# Patient Record
Sex: Male | Born: 1965
Health system: Southern US, Community
[De-identification: ages and names within clinical notes are randomized; demographics above are authoritative.]

## PROBLEM LIST (undated history)

## (undated) DIAGNOSIS — Z8669 Personal history of other diseases of the nervous system and sense organs: Secondary | ICD-10-CM

## (undated) DIAGNOSIS — E785 Hyperlipidemia, unspecified: Secondary | ICD-10-CM

## (undated) DIAGNOSIS — F32A Depression, unspecified: Secondary | ICD-10-CM

## (undated) DIAGNOSIS — F419 Anxiety disorder, unspecified: Secondary | ICD-10-CM

## (undated) DIAGNOSIS — B019 Varicella without complication: Secondary | ICD-10-CM

## (undated) DIAGNOSIS — S22000A Wedge compression fracture of unspecified thoracic vertebra, initial encounter for closed fracture: Secondary | ICD-10-CM

## (undated) DIAGNOSIS — F329 Major depressive disorder, single episode, unspecified: Secondary | ICD-10-CM

## (undated) DIAGNOSIS — T7840XA Allergy, unspecified, initial encounter: Secondary | ICD-10-CM

## (undated) DIAGNOSIS — S2239XA Fracture of one rib, unspecified side, initial encounter for closed fracture: Secondary | ICD-10-CM

## (undated) HISTORY — DX: Hyperlipidemia, unspecified: E78.5

## (undated) HISTORY — PX: KYPHOPLASTY: SHX5884

## (undated) HISTORY — DX: Wedge compression fracture of unspecified thoracic vertebra, initial encounter for closed fracture: S22.000A

## (undated) HISTORY — DX: Depression, unspecified: F32.A

## (undated) HISTORY — DX: Fracture of one rib, unspecified side, initial encounter for closed fracture: S22.39XA

## (undated) HISTORY — PX: FEMUR FRACTURE SURGERY: SHX633

## (undated) HISTORY — PX: FRACTURE SURGERY: SHX138

## (undated) HISTORY — PX: SPINE SURGERY: SHX786

## (undated) HISTORY — DX: Anxiety disorder, unspecified: F41.9

## (undated) HISTORY — DX: Allergy, unspecified, initial encounter: T78.40XA

## (undated) HISTORY — DX: Varicella without complication: B01.9

## (undated) HISTORY — PX: FEMUR CLOSED REDUCTION: SHX939

## (undated) HISTORY — DX: Personal history of other diseases of the nervous system and sense organs: Z86.69

---
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
## 2003-07-28 ENCOUNTER — Ambulatory Visit (HOSPITAL_COMMUNITY): Admission: RE | Admit: 2003-07-28 | Discharge: 2003-07-28 | Payer: Self-pay | Admitting: Internal Medicine

## 2003-07-28 ENCOUNTER — Encounter: Payer: Self-pay | Admitting: Internal Medicine

## 2003-08-03 ENCOUNTER — Ambulatory Visit (HOSPITAL_COMMUNITY): Admission: RE | Admit: 2003-08-03 | Discharge: 2003-08-03 | Payer: Self-pay | Admitting: Internal Medicine

## 2003-08-09 ENCOUNTER — Ambulatory Visit (HOSPITAL_COMMUNITY): Admission: RE | Admit: 2003-08-09 | Discharge: 2003-08-09 | Payer: Self-pay | Admitting: Internal Medicine

## 2003-08-14 ENCOUNTER — Ambulatory Visit (HOSPITAL_COMMUNITY): Admission: RE | Admit: 2003-08-14 | Discharge: 2003-08-15 | Payer: Self-pay | Admitting: Internal Medicine

## 2005-07-03 ENCOUNTER — Ambulatory Visit: Payer: Self-pay | Admitting: Internal Medicine

## 2011-03-30 ENCOUNTER — Emergency Department (HOSPITAL_COMMUNITY): Payer: 59

## 2011-03-30 ENCOUNTER — Inpatient Hospital Stay (HOSPITAL_COMMUNITY): Payer: 59

## 2011-03-30 ENCOUNTER — Inpatient Hospital Stay (HOSPITAL_COMMUNITY)
Admission: EM | Admit: 2011-03-30 | Discharge: 2011-04-07 | DRG: 482 | Disposition: A | Discharge status: Skilled Nursing Facility | Payer: 59 | Attending: Orthopaedic Surgery | Admitting: Orthopaedic Surgery

## 2011-03-30 DIAGNOSIS — F341 Dysthymic disorder: Secondary | ICD-10-CM | POA: Diagnosis present

## 2011-03-30 DIAGNOSIS — E785 Hyperlipidemia, unspecified: Secondary | ICD-10-CM | POA: Diagnosis present

## 2011-03-30 DIAGNOSIS — W010XXA Fall on same level from slipping, tripping and stumbling without subsequent striking against object, initial encounter: Secondary | ICD-10-CM | POA: Diagnosis present

## 2011-03-30 DIAGNOSIS — Y92009 Unspecified place in unspecified non-institutional (private) residence as the place of occurrence of the external cause: Secondary | ICD-10-CM

## 2011-03-30 DIAGNOSIS — S7223XA Displaced subtrochanteric fracture of unspecified femur, initial encounter for closed fracture: Principal | ICD-10-CM | POA: Diagnosis present

## 2011-03-30 LAB — CBC
HCT: 47.4 % (ref 39.0–52.0)
Hemoglobin: 16.7 g/dL (ref 13.0–17.0)
RBC: 5.3 MIL/uL (ref 4.22–5.81)
WBC: 15.4 10*3/uL — ABNORMAL HIGH (ref 4.0–10.5)

## 2011-03-30 LAB — SAMPLE TO BLOOD BANK

## 2011-03-30 LAB — BASIC METABOLIC PANEL
Chloride: 103 mEq/L (ref 96–112)
GFR calc Af Amer: >60 mL/min (ref >60)
Potassium: 3.7 mEq/L (ref 3.5–5.1)
Sodium: 140 mEq/L (ref 135–145)

## 2011-04-01 ENCOUNTER — Inpatient Hospital Stay (HOSPITAL_COMMUNITY): Payer: 59

## 2011-04-01 LAB — CBC
HCT: 25.7 % — ABNORMAL LOW (ref 39.0–52.0)
MCV: 89.2 fL (ref 78.0–100.0)
RBC: 2.88 MIL/uL — ABNORMAL LOW (ref 4.22–5.81)
WBC: 10 10*3/uL (ref 4.0–10.5)

## 2011-04-01 LAB — PROTIME-INR: INR: 1.1 (ref 0.00–1.49)

## 2011-04-02 LAB — PROTIME-INR: INR: 1.14 (ref 0.00–1.49)

## 2011-04-02 LAB — CBC
HCT: 27.3 % — ABNORMAL LOW (ref 39.0–52.0)
MCV: 88.6 fL (ref 78.0–100.0)
Platelets: 139 10*3/uL — ABNORMAL LOW (ref 150–400)
RBC: 3.08 MIL/uL — ABNORMAL LOW (ref 4.22–5.81)
WBC: 9.9 10*3/uL (ref 4.0–10.5)

## 2011-04-03 LAB — PROTIME-INR
INR: 2.43 — ABNORMAL HIGH (ref 0.00–1.49)
Prothrombin Time: 26.5 seconds — ABNORMAL HIGH (ref 11.6–15.2)

## 2011-04-03 NOTE — Op Note (Signed)
[name], [name]                  ACCOUNT NO.:  [id number]  MEDICAL RECORD NO.:  [id number]           PATIENT TYPE:  I  LOCATION:  1602                         FACILITY:  [facility]  PHYSICIAN:  Vanita Panda. Magnus Ivan, M.D.DATE OF BIRTH:  10-30-1966  DATE OF PROCEDURE:  03/30/2011 DATE OF DISCHARGE:                              OPERATIVE REPORT   PREOPERATIVE DIAGNOSIS:  Right complex subtrochanteric femur fracture.  POSTOPERATIVE DIAGNOSIS:  Right complex subtrochanteric femur fracture.  PROCEDURE:  Intramedullary nail placement with Recon nail, right femur.  IMPLANTS:  Smith and Nephew 10 x 44 right femoral nail with two 6.4 proximal screws in Recon format and two 5.0 distal screws.  SURGEON:  Doneen Poisson, MD.  ANESTHESIA:  General.  ANTIBIOTICS:  2 g IV Ancef.  BLOOD LOSS:  300 cc.  COMPLICATIONS:  None.  INDICATIONS:  Mr. Joseph Harding is a 45 year old gentleman who is 6 feet 4 inches and slipped in his yard today, twisting and falling down very hard on his right hip.  He had the inability to ambulate and was seen at the [facility] emergency room.  He was found to have a comminuted subtrochanteric proximal femur fracture of the right femur.  Due to the nature of this fracture, I recommended he undergo open reduction and internal fixation with likely intramedullary construct using a Recon nail.  I talked to him about the possibility of a large incision for cerclage wiring to get the bones lined up.  I explained the risks and benefits of the surgery to him in detail and he did wish to proceed with surgery.  PROCEDURE IN DETAIL:  After informed consent was obtained, appropriate right hip was marked.  He was brought to the operating room, placed supine on the fracture table.  General anesthesia has been obtained.  A perineal post was placed and the right operative field was placed in large scale retraction and the left hip was abducted and flexed out of the field with a  stirrup and perineal padding as well.  The bed was raised under direct fluoroscopic guidance with a pull traction and assessed the fracture and found the hip to be significantly comminuted, but overall felt like I got his rotation in length corrected.  I then had the entire leg prepped and draped with DuraPrep and sterile drapes down past the knee.  I used sterile  shower curtain and Ioban as well. Timeout was called to denote the correct patient and correct right hip. First, my decision was to make a large incision to be able to bring the proximal femur back together in a cylinder to facilitate placement of an intramedullary nail.  As the fracture fragment seem to line up reasonably well, I made a decision to first start with small incisions. The small incisions were made proximal to the greater trochanter, I was able to palpate tip of the greater trochanter.  The guidewire was then placed via drill under direct fluoroscopic guidance and opened up the proximal femur with initiating reamer.  Next, using a fracture reducer, I was able to place a guide rod all the way down  the leg.  Again, it seemed that the fracture fragments fell and more with cylinder around the guide rods, so again I made decision not over the fracture yet since I was not seeing the proximal pieces significantly flex up.  I then placed the long guidewire and removed the fracture reducer.  I chose a 44 nail link that to make a measurement from that.  I then began reaming from the sizes 9 up to 11.5.  I made decision to put a 10 x 44 nail.  I placed this in an antegrade fashion down the femur and removed the guide rod.  I then made a separate stab incision along the lateral aspect of leg and I placed 2 Recon screws in the center position as possible in the head and likewise placed 2 distal interlocks with separate stab incision.  I then assessed the fractures thoroughly to decide whether or not I should open the fracture  further to place cerclage wires to make for a better appearing x-ray; however, I felt that it is young age in fact that he was not a smoker that I could let the biology of the fracture try to heal itself without any further stripping.  Thus, decision was made to leave the fracture fragments alone to hopefully get the bone healing.  I then copiously irrigated all the wounds and closed the deep tissue with 0 Vicryl followed with 2-0 Vicryl to subcutaneous tissue and interrupted staples on the skin.  Xeroform and well padded sterile dressings applied.  We then took the perineal pouch and brought the other leg out of the stirrups and both legs were resumed at similar length as well as rotation.  We took him off the fracture table.  He was awake and extubated, and taken to the recovery room in stable condition. All final counts were correct and there were no complications noted.     Vanita Panda. Magnus Ivan, M.D.     CYB/MEDQ  D:  03/30/2011  T:  03/31/2011  Job:  [id number]  Electronically Signed by Doneen Poisson M.D. on 04/03/2011 07:58:23 PM

## 2011-04-04 LAB — PROTIME-INR: INR: 2.72 — ABNORMAL HIGH (ref 0.00–1.49)

## 2011-04-05 LAB — PROTIME-INR: Prothrombin Time: 22.7 seconds — ABNORMAL HIGH (ref 11.6–15.2)

## 2011-04-06 LAB — CBC
Hemoglobin: 9.1 g/dL — ABNORMAL LOW (ref 13.0–17.0)
MCH: 29.8 pg (ref 26.0–34.0)
MCHC: 33.3 g/dL (ref 30.0–36.0)
MCV: 89.5 fL (ref 78.0–100.0)

## 2011-04-06 LAB — PROTIME-INR: Prothrombin Time: 24.3 seconds — ABNORMAL HIGH (ref 11.6–15.2)

## 2011-04-11 NOTE — Discharge Summary (Signed)
  Joseph Harding, Joseph Harding                  ACCOUNT NO.:  [id number]  MEDICAL RECORD NO.:  [id number]           PATIENT TYPE:  I  LOCATION:  1602                         FACILITY:  [facility]  PHYSICIAN:  Vanita Panda. Magnus Ivan, M.D.DATE OF BIRTH:  10-14-1966  DATE OF ADMISSION:  03/30/2011 DATE OF DISCHARGE:  04/06/2011                              DISCHARGE SUMMARY   ADMITTING DIAGNOSIS:  Right subtrochanteric femur fracture.  DISCHARGE DIAGNOSIS:  Right subtrochanteric femur fracture.  SECONDARY DIAGNOSES: 1. Hyperlipidemia. 2. Anxiety/depression.  PROCEDURES:  Open reduction and internal fixation of right subtrochanteric femur fracture on March 30, 2011.  HOSPITAL COURSE:  Joseph Harding is a 45 year old gentleman who is 6 feet 4 inches.  He sustained a mechanical fall when he slipped on wet substance in his yard on the day of admission.  He was taken to the operating room after having found to have a complex subtrochanteric femur fracture. The intramedullary nail was placed and he was admitted as an inpatient to the orthopedic floor.  Postoperatively, his mobility has been quite limited secondary to pain.  He has been on Coumadin for DVT prophylaxis, hence felt that he should be discharged to a skilled nursing facility short-term as he gets over his pain and due to the fact he is needing a lot of assistance.  By the day of discharge, he was still requiring a lot of assistance for mobility.  His incision was clean, dry, and intact.  He was therapeutic on his INR and Coumadin.  It was felt he could be discharge today for the skilled nursing facility.  DISPOSITION:  Discharge to skilled nursing facility.  DISCHARGE INSTRUCTIONS:  While he is at the skilled nursing facility. His incision cannot get wet in a shower.  He should remain on Coumadin for DVT prophylaxis and never should be made to mobilize him with nonweightbearing on his right leg until further notice.  A followup appointment,  which should be established in my office in 2 weeks after discharge.  As I said to the [employer], [phone].  DISCHARGE MEDICATIONS: 1. Percocet 5/325 mg 1 to 2 p.o. q.4 h. to 6 h. p.r.n. pain. 2. Robaxin 500 mg 1 p.o. q.6 h. p.r.n. spasms. 3. Lipitor 10 mg p.o. daily. 4. Clonazepam 1 mg p.o. b.i.d. p.r.n. 5. Zoloft 100 mg p.o. daily. 6. Coumadin 2.5 mg p.o. daily, dose per target INR of 2.0 to 3.0.     Vanita Panda. Magnus Ivan, M.D.     CYB/MEDQ  D:  04/06/2011  T:  04/06/2011  Job:  [id number]  Electronically Signed by Doneen Poisson M.D. on 04/11/2011 11:14:34 AM

## 2011-07-20 ENCOUNTER — Other Ambulatory Visit: Payer: Self-pay | Admitting: Orthopaedic Surgery

## 2011-07-20 ENCOUNTER — Ambulatory Visit
Admission: RE | Admit: 2011-07-20 | Discharge: 2011-07-20 | Disposition: A | Discharge status: Home / Self Care | Payer: 59 | Source: Ambulatory Visit | Attending: Orthopaedic Surgery | Admitting: Orthopaedic Surgery

## 2011-07-20 DIAGNOSIS — M898X5 Other specified disorders of bone, thigh: Secondary | ICD-10-CM

## 2011-07-29 ENCOUNTER — Other Ambulatory Visit: Payer: Self-pay | Admitting: Orthopaedic Surgery

## 2011-07-29 ENCOUNTER — Encounter (HOSPITAL_COMMUNITY): Payer: 59

## 2011-07-29 LAB — CBC
HCT: 44.4 % (ref 39.0–52.0)
Hemoglobin: 15.1 g/dL (ref 13.0–17.0)
MCV: 86 fL (ref 78.0–100.0)
RBC: 5.16 MIL/uL (ref 4.22–5.81)
RDW: 14.6 % (ref 11.5–15.5)
WBC: 8.7 10*3/uL (ref 4.0–10.5)

## 2011-07-29 LAB — SURGICAL PCR SCREEN
MRSA, PCR: NEGATIVE
Staphylococcus aureus: NEGATIVE

## 2011-07-29 LAB — PROTIME-INR: INR: 1.05 (ref 0.00–1.49)

## 2011-08-07 ENCOUNTER — Inpatient Hospital Stay (HOSPITAL_COMMUNITY): Payer: 59

## 2011-08-07 ENCOUNTER — Observation Stay (HOSPITAL_COMMUNITY)
Admission: RE | Admit: 2011-08-07 | Discharge: 2011-08-08 | Disposition: A | Discharge status: Home / Self Care | Payer: 59 | Source: Ambulatory Visit | Attending: Orthopaedic Surgery | Admitting: Orthopaedic Surgery

## 2011-08-07 ENCOUNTER — Observation Stay (HOSPITAL_COMMUNITY): Payer: 59

## 2011-08-07 DIAGNOSIS — IMO0002 Reserved for concepts with insufficient information to code with codable children: Principal | ICD-10-CM | POA: Insufficient documentation

## 2011-08-07 DIAGNOSIS — F17200 Nicotine dependence, unspecified, uncomplicated: Secondary | ICD-10-CM | POA: Insufficient documentation

## 2011-08-07 DIAGNOSIS — Z01812 Encounter for preprocedural laboratory examination: Secondary | ICD-10-CM | POA: Insufficient documentation

## 2011-08-07 DIAGNOSIS — Z79899 Other long term (current) drug therapy: Secondary | ICD-10-CM | POA: Insufficient documentation

## 2011-08-11 NOTE — Op Note (Signed)
NAMECYLAN, BORUM NO.:  [id number]  MEDICAL RECORD NO.:  [id number]  LOCATION:  1616                         FACILITY:  [facility]  PHYSICIAN:  Vanita Panda. Magnus Ivan, M.D.DATE OF BIRTH:  September 11, 1966  DATE OF PROCEDURE:  08/07/2011 DATE OF DISCHARGE:                              OPERATIVE REPORT   PREOPERATIVE DIAGNOSIS:  Nonunion, right subtrochanteric femur fracture, status post reconstruction nail placement.  POSTOPERATIVE DIAGNOSIS:  Nonunion right subtrochanteric femur fracture, status post reconstruction nail placement.  PROCEDURES: 1. Removal of previous reconstruction nail, right femur. 2. Exchange now right femur with larger diameter reconstruction nail. 3. Supplemental synthetic bone grafting to a proximal femur fracture,     nonunion, with Actifuse bone graft.  IMPLANTS:  Smith and Nephew 11.5, 44 right recon nail with two 6.4 locking screws proximally, no distal locking screws.  SURGEON:  Vanita Panda. Magnus Ivan, M.D.  ASSISTANT:  Wende Neighbors, P.A.  ANESTHESIA:  General.  BLOOD LOSS:  200 cc.  ANTIBIOTICS:  2 g IV Ancef.  COMPLICATIONS:  None.  INDICATIONS:  Mr. Mcgeehan is a 45 year old gentleman who in April of this year sustained a mechanical. Fall when he slipped and fell real hard in his yard on an incline in wet grass.  He had a quite significant subtrochanteric femur fracture.  I was able to take him to the operating room at that standpoint and place a 10 x 44 reconstruction nail.  We were able to get the bone in good alignment and with gradual increase to weightbearing, he could heal this fracture.  After continued pain and 3 months, I obtained a CT scan that showed a nonunion of the proximal femur.  It was at that point that I recommended he undergo exchange nailing and bone grafting.  The risks and benefits were explained him in detail.  I did counsel him on smoking, thus he does have smoke almost a pack a day.  He  understands the risks and benefits of surgery and did wish to proceed with surgery.  DESCRIPTION OF PROCEDURE:  After informed consent was obtained, appropriate right hip was marked, he was brought to the operating room, general anesthesia was obtained.  He was then placed on the Hana fracture table with the right operating leg and then long scale retraction of the left perineal post was placed in the left hip with flexion abduction placed in a well leg holder.  His right thigh and leg were then prepped and draped with DuraPrep and sterile drapes.  A time- out was called to identify the correct the patient and correct right leg.  I then first made an incision proximally using his previous incision, I was able to dissect down to the tip of the nail.  I cleansed soft tissue from this.  I was able to get the extraction device on the proximal aspect of the nail.  We then made a stab incision distally and removed the two distal interlocks and then made a wide incision proximally where we were going to need to do bone grafting, and I took out the 2 reconstruction screws to go up into the femoral head and neck. We then  backed the rod out in its entirety.  Next, we placed a long temporary guide rod and I reamed up to a size 13 mm reamer and got excellent chatter with this.  I then was able to place the 2 new proximal screws traversing the neck and head in the recon position.  Next, I used a curette to take down the nonunion of the most proximal aspect of the fracture.  I then packed this with Actifuse bone graft and decided not put any distal interlock.  Once we were able to do this, I closed the deep tissue with #1 Vicryl followed by 2-0 Vicryl in the subcutaneous tissue and staples on the skin.  Xeroform followed by a well-padded sterile dressing was applied.  He was then awakened, extubated, and taken to the recovery room in stable condition. Postoperatively, I am going to let him  weightbear as tolerated.  We will admit him for overnight observation with hopefully increasing his activities as comfort allows.  Of note, Maud Deed, PA-C, was present during the entirety of the case and participated throughout the case.  Her assistance was integral in takedown of the nonunion as well as placement of the new hardware.     Vanita Panda. Magnus Ivan, M.D.     CYB/MEDQ  D:  08/07/2011  T:  08/08/2011  Job:  [id number]  Electronically Signed by Doneen Poisson M.D. on 08/11/2011 12:34:45 PM

## 2011-08-11 NOTE — H&P (Signed)
  NAMECHANCELLOR, Joseph Harding NO.:  [id number]  MEDICAL RECORD NO.:  [id number]  LOCATION:  0005                         FACILITY:  [facility]  PHYSICIAN:  Joseph Harding, M.D.DATE OF BIRTH:  10-04-1966  DATE OF ADMISSION:  08/07/2011 DATE OF DISCHARGE:                             HISTORY & PHYSICAL   CHIEF COMPLAINT:  Right proximal femur pain/thigh pain with nonunion of subtrochanteric femur fracture.  HISTORY OF PRESENT ILLNESS:  Joseph Harding is a 45 year old gentleman who sustained a mechanical fall and __________ back in April of this year. He suffered a subtrochanteric femur fracture on the right side and underwent intramedullary nail placement with a Recon nail.  He has since gone on to not heal the fracture, so we are taking him to the operating room today for exchange with increasing the size of the nail and submental synthetic bone grafting.  He is a smoker and he is being trying to cut back on smoking, we have counseled him about this as well. We do need to proceed with the surgery and he understands this due to his continued thigh pain and when he gets a union of this fracture.  The risks and benefits are well-understood by him while we explained this to him.  PAST MEDICAL HISTORY: 1. High cholesterol. 2. Depression.  MEDICATIONS: 1. Lipitor. 2. Zoloft. 3. Oxycodone.  ALLERGIES:  No known drug allergies.  SOCIAL HISTORY:  He is a smoker.  FAMILY MEDICAL HISTORY:  Noncontributory.  REVIEW OF SYSTEMS:  Negative for chest pain, shortness of breath, fevers, chills, nausea, or vomiting.  PHYSICAL EXAMINATION:  VITAL SIGNS:  Seen on his medical record today. GENERAL:  He is alert and oriented x3, in no acute distress, slight discomfort. HEENT:  Normocephalic, atraumatic.  Pupils are equal, round, and reactive to light.  Extraocular movements are intact. LUNGS:  Clear to auscultation bilaterally. HEART:  Regular rate and rhythm. NECK:   Benign. ABDOMEN:  Benign. EXTREMITIES:  Right lower extremity shows pain with internal and external rotation of the hip.  X-rays and CT scan confirmed a nonunion of proximal right femur fracture in the subtrochanteric region.  ASSESSMENT:  This is a 45 year old gentleman with subtrochanteric femur fracture, nonunion.  PLAN:  We are going to take him to the operating room today again for an exchange and bone graft.  He understands the risks and benefits of this and wishes to proceed with the surgery.     Joseph Harding, M.D.     CYB/MEDQ  D:  08/07/2011  T:  08/07/2011  Job:  [id number]  Electronically Signed by Doneen Poisson M.D. on 08/11/2011 12:34:43 PM

## 2011-08-31 NOTE — Discharge Summary (Signed)
  [name], Harding NO.:  [id number]  MEDICAL RECORD NO.:  [id number]  LOCATION:  1616                         FACILITY:  [facility]  PHYSICIAN:  Vanita Panda. Magnus Ivan, M.D.DATE OF BIRTH:  30-Nov-1966  DATE OF ADMISSION:  08/07/2011 DATE OF DISCHARGE:  08/08/2011                              DISCHARGE SUMMARY   ADMITTING DIAGNOSIS:  Right subtrochanteric femur fracture, nonunion.  DISCHARGE DIAGNOSIS:  Right subtrochanteric femur fracture, nonunion.  PROCEDURES: 1. Exchange of intramedullary nail of right femur. 2. Supplemental bone graft with Allograft bone graft of right proximal     femur.  HOSPITAL COURSE:  [name] is a gentleman who earlier in April had a mechanic fall in his yard, sustained a subtrochanteric femur fracture of the right leg.  He underwent open reduction and internal fixation with a Recon type of nail and we let him weightbear as tolerated after some period time of healing.  He is a smoker, now he demonstrated a nonunion of the proximal femur fracture, confirmed by a CT scan.  He also had some pain.  We talked to him about exchanging the rod for bigger rod with reaming and then bone grafting.  He was taken to the operating room on the day of admission and underwent the aforementioned procedure without complications.  He was then admitted for 23 hours of observations with lying and weightbear as tolerated.  He was quite comfortable on the day of discharge.  The incisions were clean, dry, and intact.  It was felt he could be discharged safely to home.  DISPOSITION:  Discharged to home.  DISCHARGE INSTRUCTIONS:  While he is at home, can weightbear as tolerated on his right lower extremity.  He can get his incisions wet in shower in 4 to 5 days.  We will see him back in the office in 2 weeks.  DISCHARGE MEDICATIONS: 1. Percocet. 2. Robaxin.     Vanita Panda. Magnus Ivan, M.D.     CYB/MEDQ  D:  08/23/2011  T:  08/23/2011  Job:   [id number]  Electronically Signed by Doneen Poisson M.D. on 08/31/2011 07:12:30 PM

## 2011-12-18 ENCOUNTER — Ambulatory Visit (INDEPENDENT_AMBULATORY_CARE_PROVIDER_SITE_OTHER): Payer: 59

## 2011-12-18 DIAGNOSIS — E785 Hyperlipidemia, unspecified: Secondary | ICD-10-CM

## 2011-12-18 DIAGNOSIS — F17200 Nicotine dependence, unspecified, uncomplicated: Secondary | ICD-10-CM

## 2011-12-18 DIAGNOSIS — F341 Dysthymic disorder: Secondary | ICD-10-CM

## 2012-03-20 ENCOUNTER — Ambulatory Visit (INDEPENDENT_AMBULATORY_CARE_PROVIDER_SITE_OTHER): Payer: 59 | Admitting: Internal Medicine

## 2012-03-20 VITALS — BP 122/84 | HR 78 | Temp 99.1°F | Resp 16 | Ht 74.5 in | Wt 225.6 lb

## 2012-03-20 DIAGNOSIS — S7290XA Unspecified fracture of unspecified femur, initial encounter for closed fracture: Secondary | ICD-10-CM

## 2012-03-20 DIAGNOSIS — F411 Generalized anxiety disorder: Secondary | ICD-10-CM | POA: Insufficient documentation

## 2012-03-20 DIAGNOSIS — S72309A Unspecified fracture of shaft of unspecified femur, initial encounter for closed fracture: Secondary | ICD-10-CM

## 2012-03-20 DIAGNOSIS — E789 Disorder of lipoprotein metabolism, unspecified: Secondary | ICD-10-CM

## 2012-03-20 HISTORY — DX: Unspecified fracture of shaft of unspecified femur, initial encounter for closed fracture: S72.309A

## 2012-03-20 MED ORDER — SERTRALINE HCL 100 MG PO TABS: 100.0000 mg | ORAL_TABLET | Freq: Every day | ORAL | Status: DC

## 2012-03-20 MED ORDER — CLONAZEPAM 1 MG PO TABS: 1.0000 mg | ORAL_TABLET | Freq: Every day | ORAL | Status: DC

## 2012-03-20 MED ORDER — ATORVASTATIN CALCIUM 10 MG PO TABS: 10.0000 mg | ORAL_TABLET | Freq: Every day | ORAL | Status: DC

## 2012-03-20 NOTE — Progress Notes (Signed)
  Subjective:    Patient ID: Joseph Harding, male    DOB: 1966/01/10, 46 y.o.   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 His initial panic attack occurred while driving to [place] for work and precluded jogging for about 6 months although that has passed  See last visit-cholesterol needs treatment but he was not given a refill of medication    Review of SystemsConstitution negative HEENT clear No palpitations chest pain shortness of breath No weight loss or weight gain Recent surgery x2 on his right femur after a fracture from falling in the yard the initial surgery he needed a subsequent repair/he is now active without pain     Objective:   Physical ExamVital signs are stable except weight 225 although he is 6 feet 2 inches  Neuropsych is stable     Assessment & Plan:  Problem #1 generalized anxiety disorder Problem #2 hyperlipidemia  Refill Zoloft and Klonopin for 6 months  Restart Lipitor 10 mg and check labs at [facility] in September

## 2012-04-20 ENCOUNTER — Other Ambulatory Visit: Payer: Self-pay | Admitting: Family Medicine

## 2012-05-03 IMAGING — RF DG FEMUR 2+V*R*
1 series · 7 of 7 positions shown · non-contrast
Comparison: Plain films 04/01/2011.

CLINICAL DATA: Exchange of IM nail and bone graft placement.

RIGHT FEMUR - 2 VIEW

[Series 1: run · 7 of 7 slices shown]
[im 1/7]
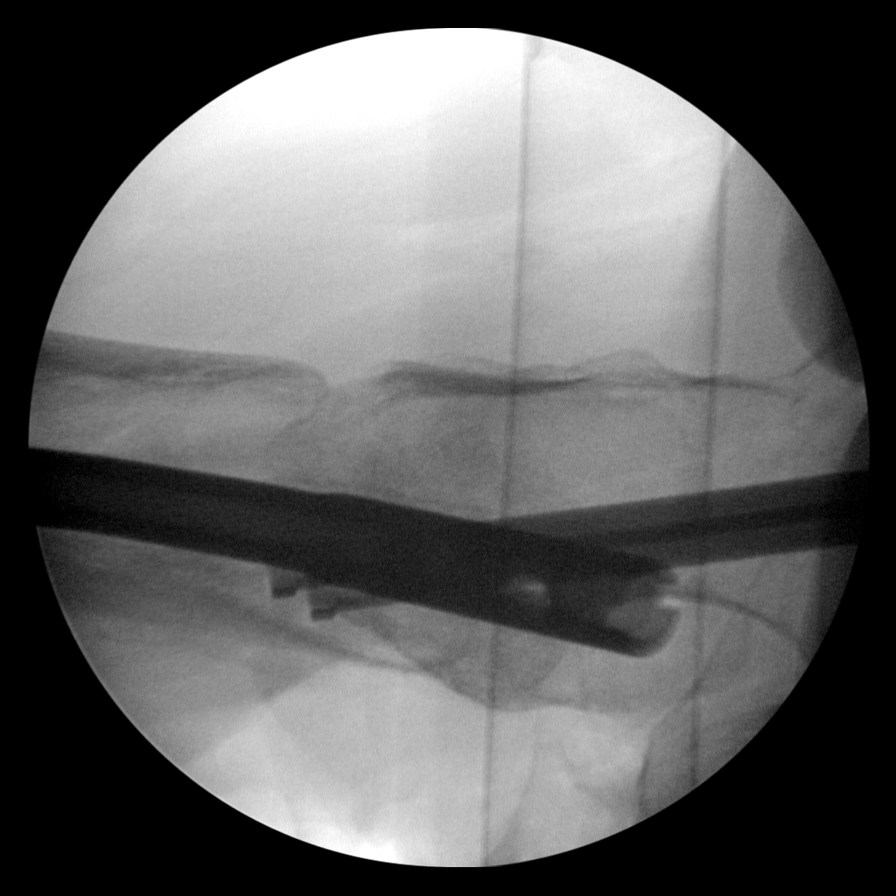
[im 2/7]
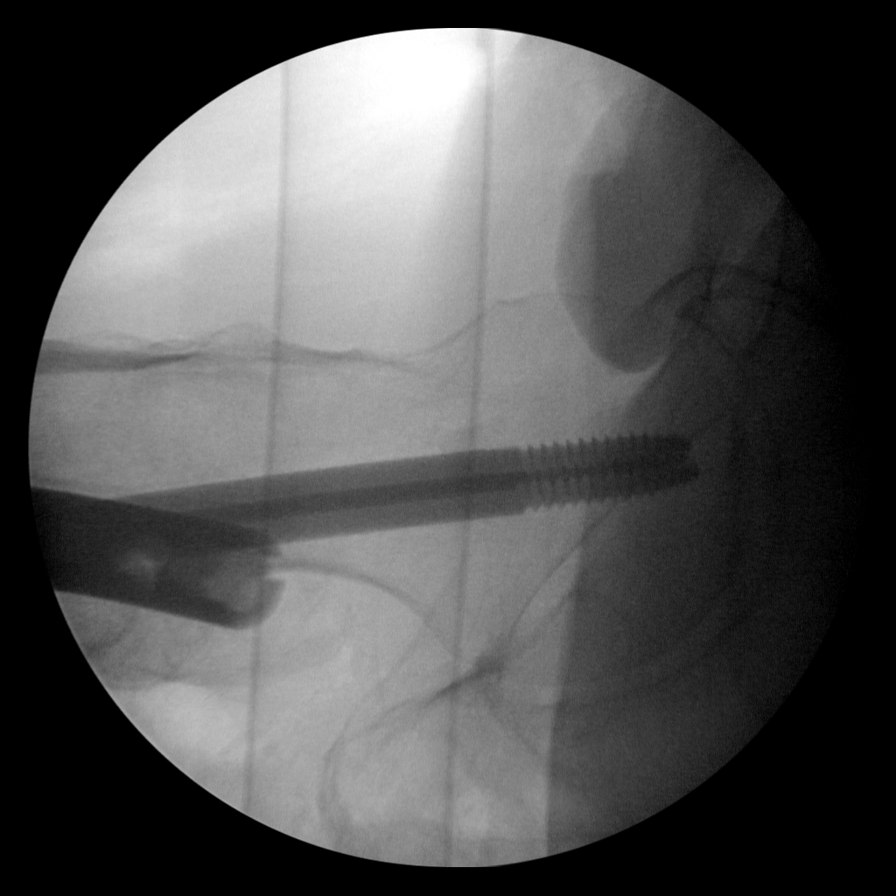
[im 3/7]
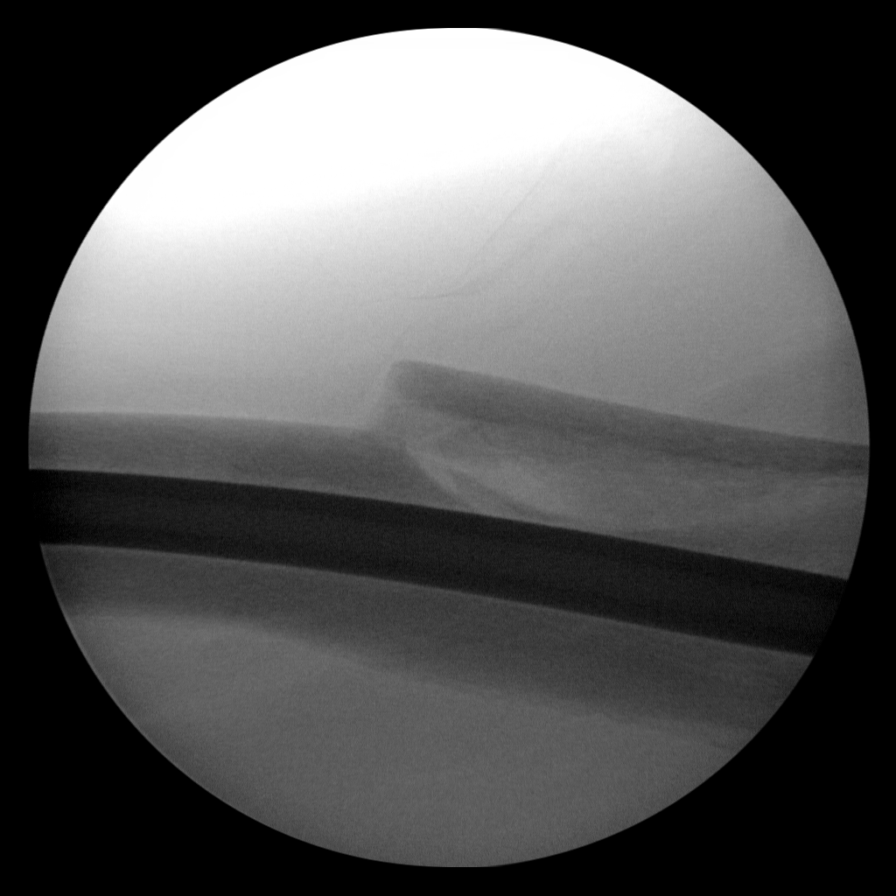
[im 4/7]
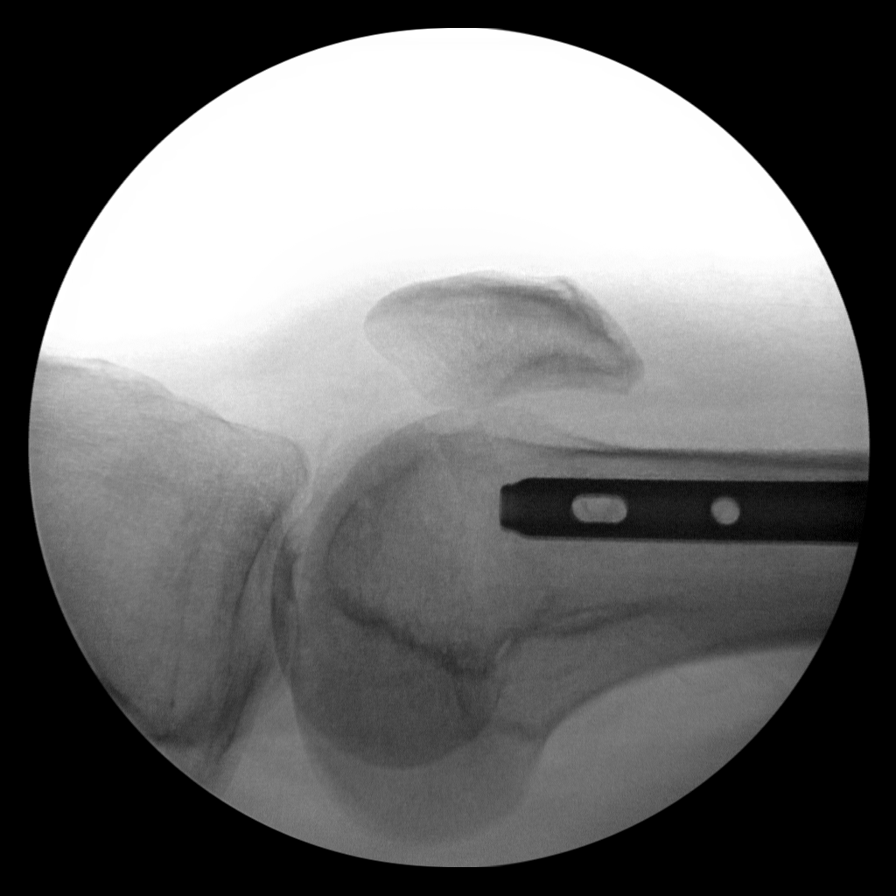
[im 5/7]
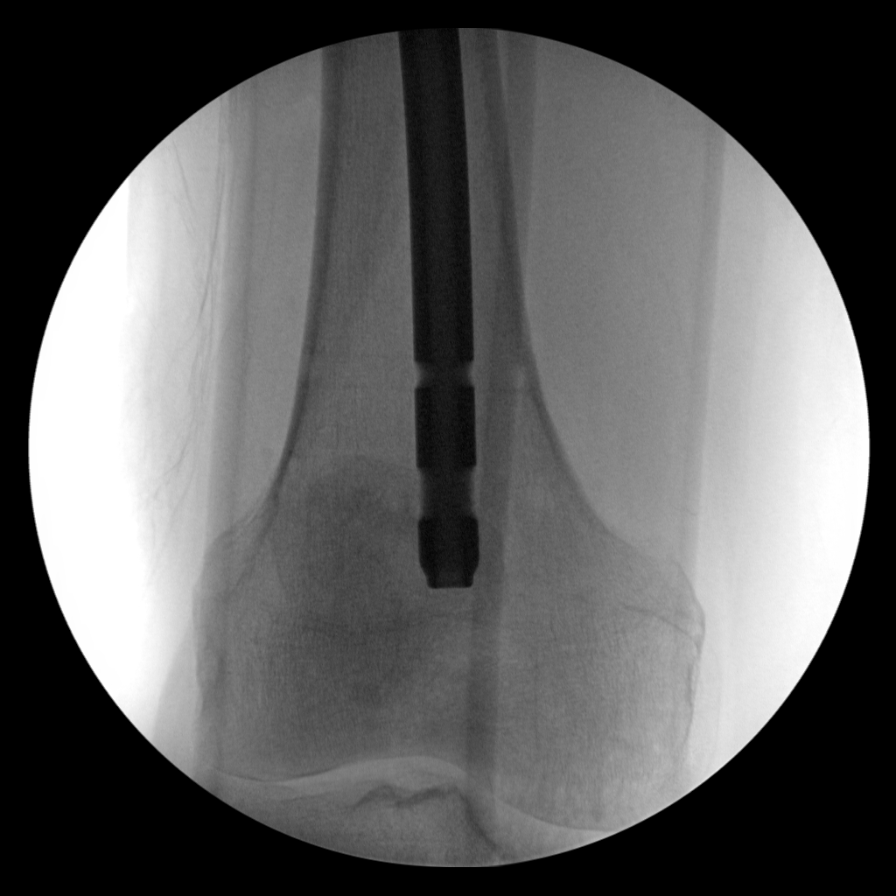
[im 6/7]
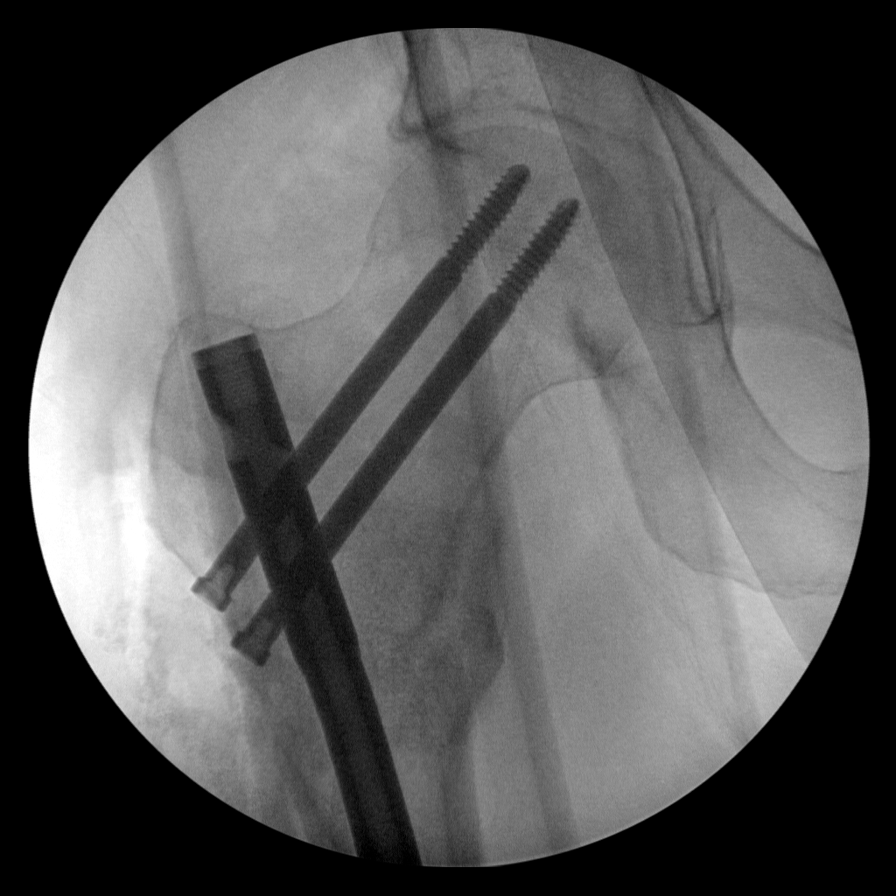
[im 7/7]
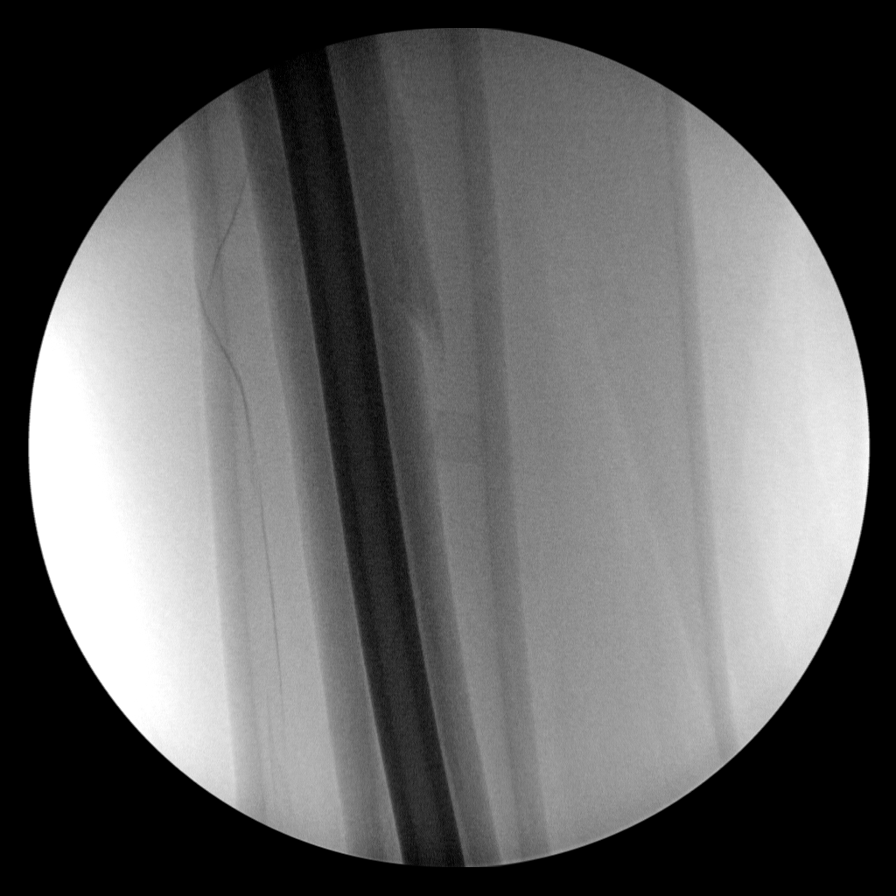

[7 of 7 positions shown; findings below may reference images not displayed]

FINDINGS: We are provided with seven intraoperative fluoroscopic
spot views of the right femur.  Images demonstrate removal of two
screws from the distal aspect of an IM nail.  Bone fragment along
the lateral aspect the proximal femur a subtrochanteric fracture
appears to have been removed with new bone graft material in place.
No new abnormality is identified.
IMPRESSION: Removal of screws from the distal aspect of the IM nail in the
right femur.  Placement of bone graft material at a subtrochanteric
femur fracture.

## 2012-05-03 IMAGING — CR DG FEMUR 2+V PORT*R*
1 series · 2 of 2 positions shown · non-contrast
Comparison: Right femur 04/01/2011.

CLINICAL DATA: Postoperative film.

PORTABLE RIGHT FEMUR - 2 VIEW

[Series 1: AP · right · 2 of 2 slices shown]
[im 1/2]
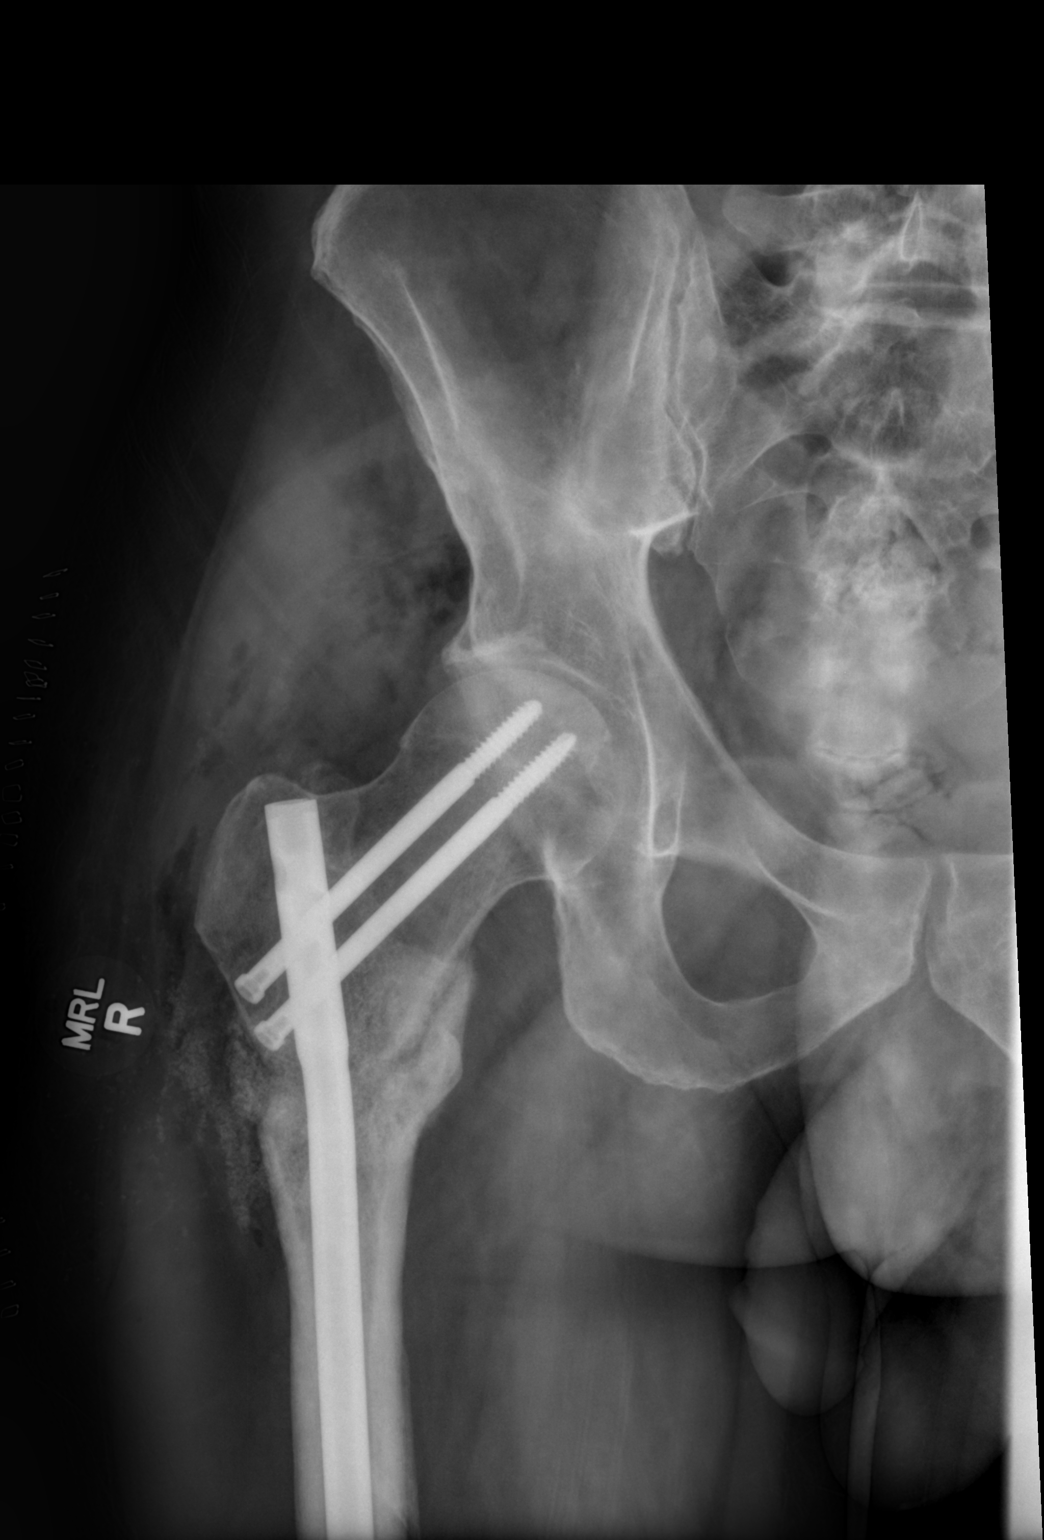
[im 2/2]
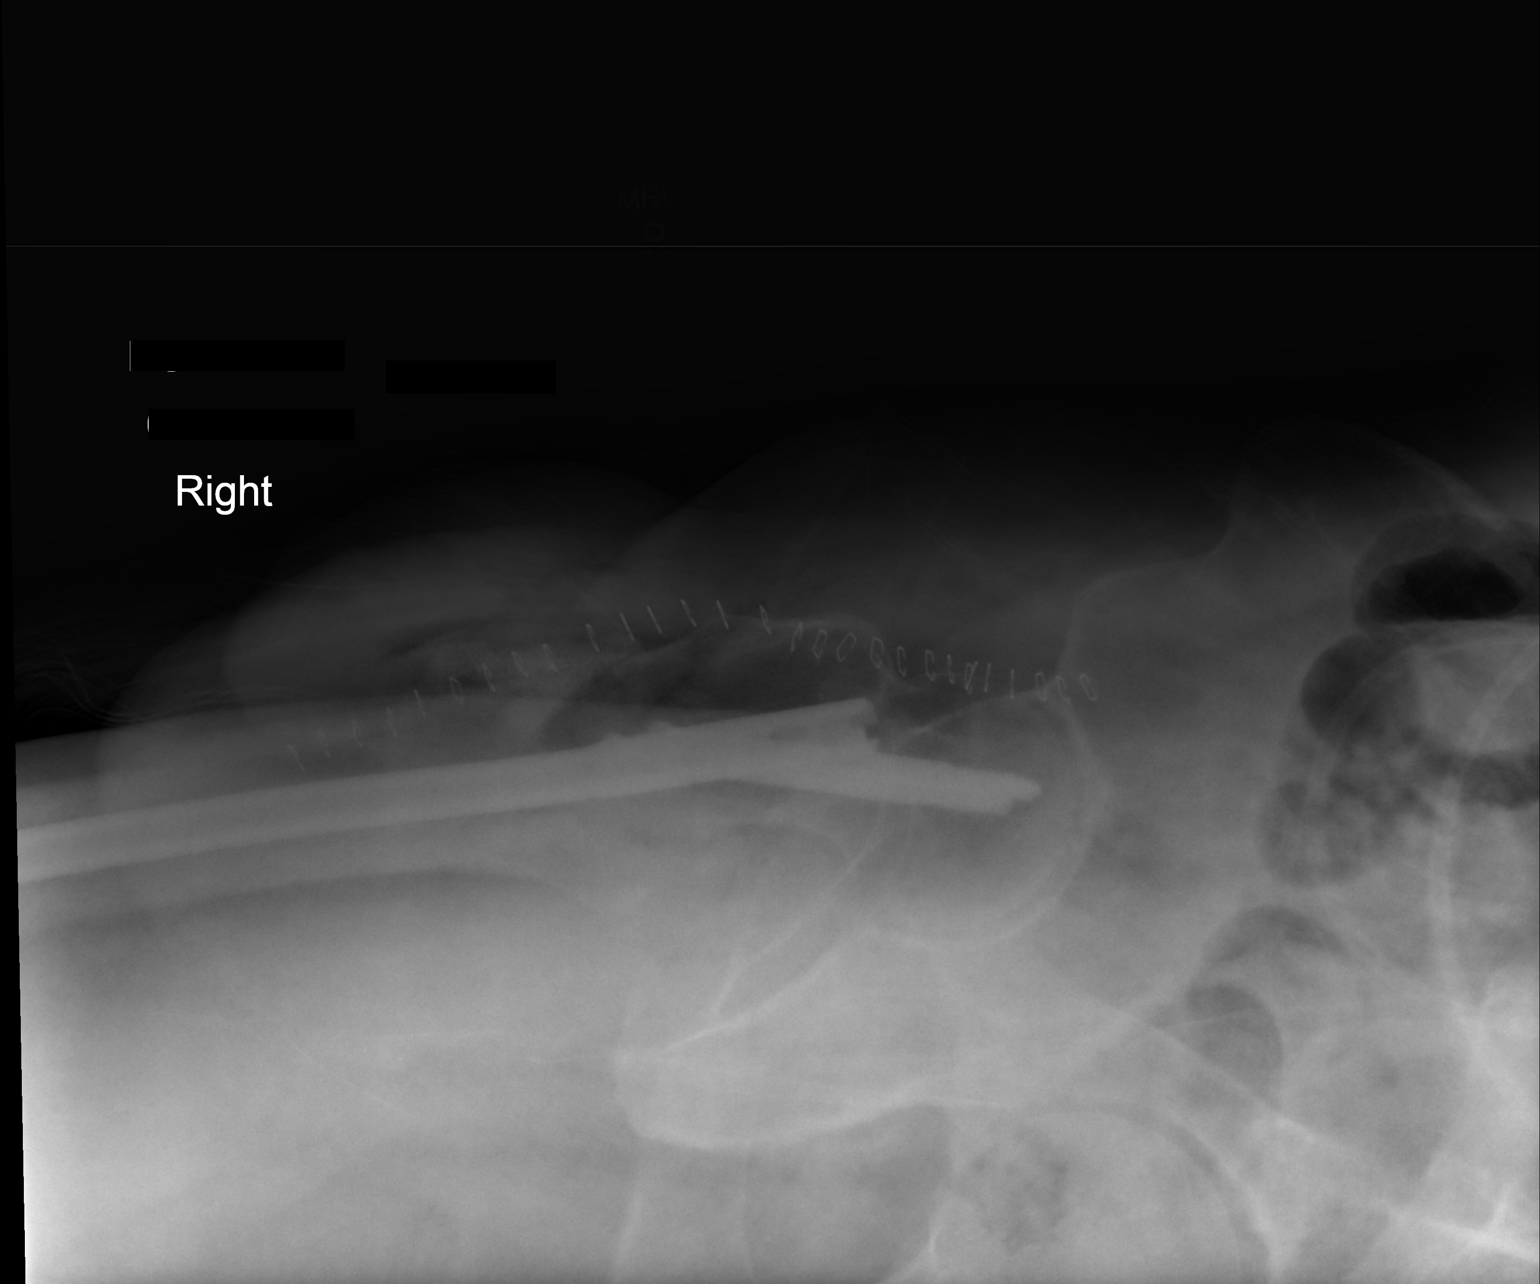

[2 of 2 positions shown; findings below may reference images not displayed]

FINDINGS: Again seen is an IM nail with two right hip screws.  A
bone fragment from the patient's subtrochanteric fracture has been
removed.  Gas in the soft tissues noted.  The patient's fracture
shows progressive healing since the prior study.  There is no acute
finding.
IMPRESSION: Debridement of a bone fragment from a previously fixed
subtrochanteric fracture.  No acute finding.  Fracture shows
progressive healing.

## 2012-05-30 ENCOUNTER — Ambulatory Visit (INDEPENDENT_AMBULATORY_CARE_PROVIDER_SITE_OTHER): Payer: BC Managed Care – PPO | Admitting: Emergency Medicine

## 2012-05-30 ENCOUNTER — Ambulatory Visit: Payer: BC Managed Care – PPO

## 2012-05-30 VITALS — BP 157/100 | HR 68 | Temp 98.6°F | Resp 18 | Ht 75.0 in | Wt 239.4 lb

## 2012-05-30 DIAGNOSIS — R079 Chest pain, unspecified: Secondary | ICD-10-CM

## 2012-05-30 DIAGNOSIS — M542 Cervicalgia: Secondary | ICD-10-CM

## 2012-05-30 DIAGNOSIS — R200 Anesthesia of skin: Secondary | ICD-10-CM

## 2012-05-30 DIAGNOSIS — R209 Unspecified disturbances of skin sensation: Secondary | ICD-10-CM

## 2012-05-30 MED ORDER — PREDNISONE 20 MG PO TABS: ORAL_TABLET | ORAL | Status: DC

## 2012-05-30 MED ORDER — OXYCODONE-ACETAMINOPHEN 5-325 MG PO TABS: ORAL_TABLET | ORAL | Status: DC

## 2012-05-30 NOTE — Patient Instructions (Addendum)
Cervical Radiculopathy Cervical radiculopathy happens when a nerve in the neck is pinched or bruised by a slipped (herniated) disk or by arthritic changes in the bones of the cervical spine. This can occur due to an injury or as part of the normal aging process. Pressure on the cervical nerves can cause pain or numbness that runs from your neck all the way down into your arm and fingers. CAUSES  There are many possible causes, including:  Injury.   Muscle tightness in the neck from overuse.   Swollen, painful joints (arthritis).   Breakdown or degeneration in the bones and joints of the spine (spondylosis) due to aging.   Bone spurs that may develop near the cervical nerves.  SYMPTOMS  Symptoms include pain, weakness, or numbness in the affected arm and hand. Pain can be severe or irritating. Symptoms may be worse when extending or turning the neck. DIAGNOSIS  Your caregiver will ask about your symptoms and do a physical exam. He or she may test your strength and reflexes. X-rays, CT scans, and MRI scans may be needed in cases of injury or if the symptoms do not go away after a period of time. Electromyography (EMG) or nerve conduction testing may be done to study how your nerves and muscles are working. TREATMENT  Your caregiver may recommend certain exercises to help relieve your symptoms. Cervical radiculopathy can, and often does, get better with time and treatment. If your problems continue, treatment options may include:  Wearing a soft collar for short periods of time.   Physical therapy to strengthen the neck muscles.   Medicines, such as nonsteroidal anti-inflammatory drugs (NSAIDs), oral corticosteroids, or spinal injections.   Surgery. Different types of surgery may be done depending on the cause of your problems.  HOME CARE INSTRUCTIONS   Put ice on the affected area.   Put ice in a plastic bag.   Place a towel between your skin and the bag.   Leave the ice on for 15  to 20 minutes, 3 to 4 times a day or as directed by your caregiver.   Use a flat pillow when you sleep.   Only take over-the-counter or prescription medicines for pain, discomfort, or fever as directed by your caregiver.   If physical therapy was prescribed, follow your caregiver's directions.   If a soft collar was prescribed, use it as directed.  SEEK IMMEDIATE MEDICAL CARE IF:   Your pain gets much worse and cannot be controlled with medicines.   You have weakness or numbness in your hand, arm, face, or leg.   You have a high fever or a stiff, rigid neck.   You lose bowel or bladder control (incontinence).   You have trouble with walking, balance, or speaking.  MAKE SURE YOU:   Understand these instructions.   Will watch your condition.   Will get help right away if you are not doing well or get worse.  Document Released: 08/18/2001 Document Revised: 11/12/2011 Document Reviewed: 07/07/2011 [employer] Patient Information 2012 [employer]. 

## 2012-05-30 NOTE — Progress Notes (Signed)
  Subjective:    Patient ID: Joseph Harding, male    DOB: 02/21/1966, 46 y.o.   MRN: [id number]  HPI patient presents with a three-week history of severe pain in his neck. He has very limited range of motion of his neck it hurts to move his neck has had trouble with a known sensation which goes down his left arm. This is not associated with left hand weakness. He works at a desk type job. He had a previous injury to his thoracic spine which required a T8 kyphoplasty. He has had no previous neck problems    Review of Systems     Objective:   Physical Exam there is significant decreased motion of the neck. He is unable to turn his neck to the right he is tender over the left pericervical muscles and left periscapular muscles to deep tendon reflexes are decreased left brachial radialis compared to the right triceps and biceps reflexes are 2+. There is no focal weakness  noted.  EKG NSR [facility] reading (PRIMARY) by  Dr.Hazen Brumett C6-C7 degenerative disease       Assessment & Plan:  Patient is C6-C7 degenerative disc disease with left cervical radiculopathy. We'll with a steroid Dosepak along with oxycodone for pain at night. We'll recheck in one week

## 2012-06-11 ENCOUNTER — Ambulatory Visit (INDEPENDENT_AMBULATORY_CARE_PROVIDER_SITE_OTHER): Payer: BC Managed Care – PPO | Admitting: Emergency Medicine

## 2012-06-11 VITALS — BP 122/78 | HR 94 | Temp 98.0°F | Resp 18 | Ht 74.5 in | Wt 237.6 lb

## 2012-06-11 DIAGNOSIS — M542 Cervicalgia: Secondary | ICD-10-CM

## 2012-06-11 MED ORDER — OXYCODONE-ACETAMINOPHEN 5-325 MG PO TABS: ORAL_TABLET | ORAL | Status: DC

## 2012-06-11 MED ORDER — CYCLOBENZAPRINE HCL 10 MG PO TABS: ORAL_TABLET | ORAL | Status: DC

## 2012-06-11 MED ORDER — MELOXICAM 7.5 MG PO TABS: ORAL_TABLET | ORAL | Status: DC

## 2012-06-11 NOTE — Progress Notes (Signed)
  Subjective:    Patient ID: Joseph Harding, male    DOB: 06/15/1966, 46 y.o.   MRN: [id number]  HPI patient and recheck his neck. He was treated for cervical disc disease and is feeling much better. He has much better range of motion in his neck    Review of Systems     Objective:   Physical Exam there is tenderness over the posterior cervical muscles. There is persistent decreased range of motion of the neck. The left brachial radialis reflex is diminished compared to the right        Assessment & Plan:  The patient improved after treatment for cervical disc disease. He is moving much better. He has much improved range of motion. We'll continue him on an anti-inflammatory with muscle relaxant at night. His pain medications were refilled he will call on Monday and let me know if his insurance approves physical therapy versus chiropractic care.

## 2012-06-11 NOTE — Patient Instructions (Signed)
Cervical Strain Care After A cervical strain is when the muscles and ligaments in your neck have been stretched. The bones are not broken. If you had any problems moving your arms or legs immediately after the injury, even if the problem has gone away, make sure to tell this to your caregiver.  HOME CARE INSTRUCTIONS   While awake, apply ice packs to the neck or areas of pain about every 1 to 2 hours, for 15 to 20 minutes at a time. Do this for 2 days. If you were given a cervical collar for support, ask your caregiver if you may remove it for bathing or applying ice.   If given a cervical collar, wear as instructed. Do not remove any collar unless instructed by a caregiver.   Only take over-the-counter or prescription medicines for pain, discomfort, or fever as directed by your caregiver.  Recheck with the hospital or clinic after a radiologist has read your X-rays. Recheck with the hospital or clinic to make sure the initial readings are correct. Do this also to determine if you need further studies. It is your responsibility to find out your X-ray results. X-rays are sometimes repeated in one week to ten days. These are often repeated to make sure that a hairline fracture was not overlooked. Ask your caregiver how you are to find out about your radiology (X-ray) results. SEEK IMMEDIATE MEDICAL CARE IF:   You have increasing pain in your neck.   You develop difficulties swallowing or breathing.   You have numbness, weakness, or movement problems in the arms or legs.   You have difficulty walking.   You develop bowel or bladder retention or incontinence.   You have problems with walking.  MAKE SURE YOU:   Understand these instructions.   Will watch your condition.   Will get help right away if you are not doing well or get worse.  Document Released: 11/23/2005 Document Revised: 08/05/2011 Document Reviewed: 07/06/2008 [employer] Patient Information 2012 [employer]. 

## 2012-06-13 ENCOUNTER — Telehealth: Payer: Self-pay

## 2012-06-13 DIAGNOSIS — M542 Cervicalgia: Secondary | ICD-10-CM

## 2012-06-13 NOTE — Telephone Encounter (Signed)
Dr. Cleta Alberts, Mr. Glance called back and stated that his insurance will pay for a chiropractor.  Please start the referral if possible.

## 2012-06-13 NOTE — Telephone Encounter (Signed)
Please fill out a referral to Dr. Genene Churn chiropractor on [facility]

## 2012-06-13 NOTE — Telephone Encounter (Signed)
Pt was here to see dr Cleta Alberts, and dr Cleta Alberts told him to find out if insurance would cover a chiropractor, and his ins company said they would, he would like to proceed with referral for the chiropractoper

## 2012-06-14 ENCOUNTER — Telehealth: Payer: Self-pay

## 2012-09-10 ENCOUNTER — Other Ambulatory Visit: Payer: Self-pay | Admitting: Internal Medicine

## 2012-11-07 ENCOUNTER — Ambulatory Visit (INDEPENDENT_AMBULATORY_CARE_PROVIDER_SITE_OTHER): Payer: BC Managed Care – PPO | Admitting: Family Medicine

## 2012-11-07 VITALS — BP 105/70 | HR 88 | Temp 99.0°F | Resp 18 | Wt 240.0 lb

## 2012-11-07 DIAGNOSIS — F419 Anxiety disorder, unspecified: Secondary | ICD-10-CM

## 2012-11-07 DIAGNOSIS — F411 Generalized anxiety disorder: Secondary | ICD-10-CM

## 2012-11-07 MED ORDER — CLONAZEPAM 1 MG PO TABS: 1.0000 mg | ORAL_TABLET | Freq: Every day | ORAL | Status: DC

## 2012-11-07 MED ORDER — SERTRALINE HCL 100 MG PO TABS: 100.0000 mg | ORAL_TABLET | Freq: Every day | ORAL | Status: DC

## 2012-11-07 NOTE — Progress Notes (Signed)
[facility] [address] [place] [address] [phone]  Date:  11/07/2012   Name:  Joseph Harding   DOB:  1966-09-11   MRN:  [id number]  PCP:  Elvina Sidle, MD    Chief Complaint: Anxiety and Depression   History of Present Illness:  Joseph Harding is a 46 y.o. very pleasant male patient who presents with the following:  Here today for a refill of his medications.  He is taking zoloft and takes klonopin once daily.  He has been treated for anxiety and panic attacks for over 10 years.  He states that now that he is familiar with his symptoms they are much less frightening to him.  He occasionally feels a little nervous while driving but nothing dramatic.  He feels that he is doing well and his symptoms are stable.  He had a good holiday and birthday.     Patient Active Problem List  Diagnosis  . GAD (generalized anxiety disorder)  . Fx femur shaft-closed    Past Medical History  Diagnosis Date  . Anxiety     Past Surgical History  Procedure Date  . Spine surgery   . Fracture surgery     History  Substance Use Topics  . Smoking status: Current Every Day Smoker  . Smokeless tobacco: Not on file  . Alcohol Use: No    Family History  Problem Relation Age of Onset  . Cancer Mother   . Cancer Father   . Cancer Maternal Grandfather   . Stroke Paternal Grandfather     Allergies  Allergen Reactions  . Codeine     Medication list has been reviewed and updated.  Current Outpatient Prescriptions on File Prior to Visit  Medication Sig Dispense Refill  . clonazePAM (KLONOPIN) 1 MG tablet Take 1 tablet (1 mg total) by mouth daily. Needs office visit  30 tablet  0  . sertraline (ZOLOFT) 100 MG tablet Take 1 tablet (100 mg total) by mouth daily.  90 tablet  3    Review of Systems:  As per HPI- otherwise negative.   Physical Examination: Filed Vitals:   11/07/12 1059  BP: 105/70  Pulse: 88  Temp: 99 F (37.2 C)  Resp: 18   Filed  Vitals:   11/07/12 1059  Weight: 240 lb (108.863 kg)   There is no height on file to calculate BMI. Ideal Body Weight:    GEN: WDWN, NAD, Non-toxic, A & O x 3 HEENT: Atraumatic, Normocephalic. Neck supple. No masses, No LAD. Ears and Nose: No external deformity. CV: RRR, No M/G/R. No JVD. No thrill. No extra heart sounds. PULM: CTA B, no wheezes, crackles, rhonchi. No retractions. No resp. distress. No accessory muscle use. EXTR: No c/c/e NEURO Normal gait.  PSYCH: Normally interactive. Conversant. Not depressed or anxious appearing.  Calm demeanor.    Assessment and Plan: 1. Anxiety  sertraline (ZOLOFT) 100 MG tablet, clonazePAM (KLONOPIN) 1 MG tablet   Symptoms are well controlled.  Did refills today.  Recheck in 6 months.  He usually sees Dr. Merla Riches.  He will let me know if any changes in his symptoms.   Meds ordered this encounter  Medications  . sertraline (ZOLOFT) 100 MG tablet    Sig: Take 1 tablet (100 mg total) by mouth daily.    Dispense:  30 tablet    Refill:  11  . clonazePAM (KLONOPIN) 1 MG tablet    Sig: Take 1 tablet (1 mg total)  by mouth daily. Needs office visit    Dispense:  30 tablet    Refill:  5     Daja Shuping, MD

## 2012-11-07 NOTE — Patient Instructions (Addendum)
Please let us know if you have any change in your symptoms or other problems.  Take care

## 2012-12-18 ENCOUNTER — Other Ambulatory Visit: Payer: Self-pay | Admitting: Physician Assistant

## 2013-04-12 ENCOUNTER — Other Ambulatory Visit: Payer: Self-pay | Admitting: Internal Medicine

## 2013-05-13 ENCOUNTER — Other Ambulatory Visit: Payer: Self-pay | Admitting: Family Medicine

## 2013-05-15 NOTE — Telephone Encounter (Signed)
Dr Copland, do you want to RF? 

## 2013-05-25 ENCOUNTER — Encounter: Payer: Self-pay | Admitting: Family Medicine

## 2013-05-25 ENCOUNTER — Ambulatory Visit (INDEPENDENT_AMBULATORY_CARE_PROVIDER_SITE_OTHER): Payer: BC Managed Care – PPO | Admitting: Family Medicine

## 2013-05-25 VITALS — BP 126/84 | HR 96 | Temp 97.6°F | Resp 16 | Ht 76.0 in | Wt 248.4 lb

## 2013-05-25 DIAGNOSIS — T6391XA Toxic effect of contact with unspecified venomous animal, accidental (unintentional), initial encounter: Secondary | ICD-10-CM

## 2013-05-25 DIAGNOSIS — Z91030 Bee allergy status: Secondary | ICD-10-CM

## 2013-05-25 MED ORDER — METHYLPREDNISOLONE ACETATE 80 MG/ML IJ SUSP
80.0000 mg | Freq: Once | INTRAMUSCULAR | Status: AC
  Administered 2013-05-25: 80 mg via INTRAMUSCULAR

## 2013-05-25 MED ORDER — EPINEPHRINE 0.3 MG/0.3ML IJ SOAJ: 0.3000 mg | Freq: Once | INTRAMUSCULAR | Status: DC

## 2013-05-25 NOTE — Progress Notes (Signed)
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 He lives in [place]. He has been stung once a few weeks ago, with a fairly mild reaction. Yesterday he was stung twice, on the right ear and right hand. He was not wearing protective gear. This morning he was itching badly with his right ear swollen and the left hand swollen. There is also some pain with it. He took a Claritin last night, but he came on in to get this checked today. No history of systemic symptoms.  Objective: Swollen right earlobe. It looks like you're stung in the lower part of the year. There is a little tenderness in the neck just below that. He also swollen on the left hand at the base of the left common back the hand. Chest was clear. Heart regular  Assessment: Bee sting allergic local reaction  Plan: Depo-Medrol 80 Prescription for an EpiPen In the event of stains, he should take Benadryl promptly. Zyrtec may be the better antihistamine for longer itching and irritation

## 2013-05-25 NOTE — Patient Instructions (Signed)
For stings take Benadryl 50 mg promptly.  Use epipen if any systemic symptoms.  Begin zyrtec daily today.

## 2013-06-15 ENCOUNTER — Other Ambulatory Visit: Payer: Self-pay | Admitting: Family Medicine

## 2013-06-16 ENCOUNTER — Telehealth: Payer: Self-pay | Admitting: Radiology

## 2017-02-15 DIAGNOSIS — E7800 Pure hypercholesterolemia, unspecified: Secondary | ICD-10-CM | POA: Diagnosis not present

## 2017-08-19 DIAGNOSIS — Z0001 Encounter for general adult medical examination with abnormal findings: Secondary | ICD-10-CM | POA: Diagnosis not present

## 2017-08-19 DIAGNOSIS — E7800 Pure hypercholesterolemia, unspecified: Secondary | ICD-10-CM | POA: Diagnosis not present

## 2017-12-30 DIAGNOSIS — E7800 Pure hypercholesterolemia, unspecified: Secondary | ICD-10-CM | POA: Diagnosis not present

## 2018-06-29 DIAGNOSIS — E7800 Pure hypercholesterolemia, unspecified: Secondary | ICD-10-CM | POA: Diagnosis not present

## 2018-08-27 ENCOUNTER — Encounter (HOSPITAL_COMMUNITY): Payer: Self-pay

## 2018-08-27 ENCOUNTER — Other Ambulatory Visit: Payer: Self-pay

## 2018-08-27 ENCOUNTER — Emergency Department (HOSPITAL_COMMUNITY): Payer: 59

## 2018-08-27 ENCOUNTER — Emergency Department (HOSPITAL_COMMUNITY)
Admission: EM | Admit: 2018-08-27 | Discharge: 2018-08-28 | Disposition: A | Discharge status: Home / Self Care | Payer: 59 | Attending: Emergency Medicine | Admitting: Emergency Medicine

## 2018-08-27 DIAGNOSIS — R200 Anesthesia of skin: Secondary | ICD-10-CM | POA: Diagnosis not present

## 2018-08-27 DIAGNOSIS — F17210 Nicotine dependence, cigarettes, uncomplicated: Secondary | ICD-10-CM | POA: Diagnosis not present

## 2018-08-27 DIAGNOSIS — R0789 Other chest pain: Secondary | ICD-10-CM | POA: Insufficient documentation

## 2018-08-27 DIAGNOSIS — M549 Dorsalgia, unspecified: Secondary | ICD-10-CM | POA: Diagnosis not present

## 2018-08-27 DIAGNOSIS — R0602 Shortness of breath: Secondary | ICD-10-CM | POA: Diagnosis not present

## 2018-08-27 DIAGNOSIS — R61 Generalized hyperhidrosis: Secondary | ICD-10-CM | POA: Insufficient documentation

## 2018-08-27 DIAGNOSIS — Z79899 Other long term (current) drug therapy: Secondary | ICD-10-CM | POA: Diagnosis not present

## 2018-08-27 DIAGNOSIS — R52 Pain, unspecified: Secondary | ICD-10-CM | POA: Diagnosis not present

## 2018-08-27 DIAGNOSIS — R079 Chest pain, unspecified: Secondary | ICD-10-CM | POA: Diagnosis not present

## 2018-08-27 LAB — CBC
HCT: 44.7 % (ref 39.0–52.0)
Hemoglobin: 15 g/dL (ref 13.0–17.0)
MCH: 31.1 pg (ref 26.0–34.0)
MCHC: 33.6 g/dL (ref 30.0–36.0)
MCV: 92.5 fL (ref 78.0–100.0)
PLATELETS: 166 10*3/uL (ref 150–400)
RBC: 4.83 MIL/uL (ref 4.22–5.81)
RDW: 13.2 % (ref 11.5–15.5)
WBC: 13.4 10*3/uL — ABNORMAL HIGH (ref 4.0–10.5)

## 2018-08-27 LAB — BASIC METABOLIC PANEL
Anion gap: 10 (ref 5–15)
BUN: 13 mg/dL (ref 6–20)
CALCIUM: 9.8 mg/dL (ref 8.9–10.3)
CO2: 25 mmol/L (ref 22–32)
CREATININE: 1.04 mg/dL (ref 0.61–1.24)
Chloride: 104 mmol/L (ref 98–111)
GFR calc non Af Amer: >60 mL/min (ref >60)
GLUCOSE: 93 mg/dL (ref 70–99)
Potassium: 3.9 mmol/L (ref 3.5–5.1)
Sodium: 139 mmol/L (ref 135–145)

## 2018-08-27 LAB — I-STAT TROPONIN, ED: TROPONIN I, POC: 0 ng/mL (ref 0.00–0.08)

## 2018-08-27 NOTE — ED Triage Notes (Signed)
Pt BIB [facility] for eval of chest pain since Tuesday. Pt reports he started w/ back pain Monday, Tuesday started w/ both chest and back pain which has not resolved since. Has hx of spinal surgery, thought pain was due to that, but pain typically spontaneously resolves, and has not done so. Tonight pain was severe and prompted him to call 911. Reports had similar episode on Wednesday where he broke out into sweats, shaking and dry mouth w/ severe chest pain. GCS 15, A&Ox4, 2/10 CP on arrival. 324 ASA by EMS

## 2018-08-27 NOTE — ED Provider Notes (Signed)
[facility] EMERGENCY DEPARTMENT Provider Note   CSN: [id number] Arrival date & time: 08/27/18  2215     History   Chief Complaint Chief Complaint  Patient presents with  . Chest Pain    HPI Joseph Harding. is a 52 y.o. male.  52 y.o male with a PMH of Anxiety presents to the ED via EMS with a chief complaint of chest pain which began Tuesday. Patient describes the chest pain as intermittent in the middle of his chest along with diaphoresis and hand numbness. He reports he has gotten this pain before but it will usually radiate to his back. Patient has a previous history of back surgery in the thoracic region but is uncertain on the type of procedure.  It was seen by a cardiologist 3 years ago and had a stress test which results were never reported to patient.  I have personally reviewed this patient's chart and see that the stress test was done 4 years ago.  Patient reports after the chest pain began on Tuesday it kind of improved however on Thursday when he went into work he felt "lethargic, pale "and had "labored breathing ".  Patient has not tried any medical therapy for relief.  He received 4 aspirin by EMS.  She denies a previous history of CAD, he is a current smoker reporting to smoke 15 cigarettes a day.  Denies any nausea, vomiting, or headache.   Chest Pain   This is a recurrent problem. The current episode started more than 2 days ago. The problem occurs every several days. The problem has been gradually worsening. The pain is associated with rest. The pain is present in the substernal region. The pain is at a severity of 8/10. The pain is moderate. The quality of the pain is described as pressure-like. The pain radiates to the mid back. The symptoms are aggravated by deep breathing. Associated symptoms include back pain, diaphoresis and shortness of breath. Pertinent negatives include no abdominal pain, no nausea and no weakness. He has tried nothing for the  symptoms. The treatment provided mild relief. Risk factors include male gender, smoking/tobacco exposure, sedentary lifestyle and alcohol intake.  His past medical history is significant for anxiety/panic attacks.  Pertinent negatives for family medical history include: no CAD.  Procedure history is positive for exercise treadmill test.    Past Medical History:  Diagnosis Date  . Anxiety     Patient Active Problem List   Diagnosis Date Noted  . GAD (generalized anxiety disorder) 03/20/2012  . Fx femur shaft-closed ([place]) 03/20/2012    Past Surgical History:  Procedure Laterality Date  . FRACTURE SURGERY    . SPINE SURGERY          Home Medications    Prior to Admission medications   Medication Sig Start Date End Date Taking? Authorizing Provider  clonazePAM (KLONOPIN) 1 MG tablet Take 1 tablet (1 mg total) by mouth daily as needed for anxiety. You must be seen at clinic prior to any further refills please Patient taking differently: Take 1 mg by mouth at bedtime.  05/13/13  Yes Copland, Gay Filler, MD  pravastatin (PRAVACHOL) 20 MG tablet Take 20 mg by mouth at bedtime.  07/29/18  Yes [provider]  sertraline (ZOLOFT) 100 MG tablet Take 1 tablet (100 mg total) by mouth daily. Patient taking differently: Take 100 mg by mouth at bedtime.  11/07/12  Yes Copland, Gay Filler, MD  EPINEPHrine (EPIPEN) 0.3 mg/0.3 mL DEVI Inject  0.3 mLs (0.3 mg total) into the muscle once. Patient not taking: Reported on 08/27/2018 05/25/13   Posey Boyer, MD  sertraline (ZOLOFT) 100 MG tablet TAKE 1 TABLET (100 MG TOTAL) BY MOUTH DAILY. Patient not taking: Reported on 08/27/2018 04/12/13   Robyn Haber, MD    Family History Family History  Problem Relation Age of Onset  . Cancer Mother   . Cancer Father   . Cancer Maternal Grandfather   . Stroke Paternal Grandfather     Social History Social History   Tobacco Use  . Smoking status: Current Every Day Smoker  Substance Use Topics    . Alcohol use: No  . Drug use: No     Allergies   Codeine   Review of Systems Review of Systems  Constitutional: Positive for diaphoresis.  HENT: Negative for sore throat.   Eyes: Negative for visual disturbance.  Respiratory: Positive for shortness of breath.   Cardiovascular: Positive for chest pain.  Gastrointestinal: Negative for abdominal pain and nausea.  Genitourinary: Negative for flank pain and hematuria.  Musculoskeletal: Positive for back pain.  Neurological: Negative for syncope, weakness and light-headedness.  All other systems reviewed and are negative.    Physical Exam Updated Vital Signs BP 116/80   Pulse 67   Temp 98.7 F (37.1 C)   Resp 14   Ht 6\' 4"  (7.32 m)   Wt 113.4 kg   SpO2 98%   BMI 30.43 kg/m   Physical Exam  Constitutional: He is oriented to person, place, and time. He appears well-developed and well-nourished. No distress.  Non-distressed laying in bed with friends at the bedside.   HENT:  Head: Normocephalic and atraumatic.  Neck: Normal range of motion. Neck supple.  Cardiovascular: Normal rate.  Pulmonary/Chest: Effort normal. No respiratory distress. He has decreased breath sounds. He has no wheezes. He has no rales.  Throughout all lungs fields.   Abdominal: Soft. Bowel sounds are normal. There is no tenderness.  Neurological: He is alert and oriented to person, place, and time.  Skin: Skin is warm and dry.  Nursing note and vitals reviewed.    ED Treatments / Results  Labs (all labs ordered are listed, but only abnormal results are displayed) Labs Reviewed  CBC - Abnormal; Notable for the following components:      Result Value   WBC 13.4 (*)    All other components within normal limits  BASIC METABOLIC PANEL  HEPATIC FUNCTION PANEL  I-STAT TROPONIN, ED  I-STAT TROPONIN, ED    EKG EKG Interpretation  Date/Time:  Saturday August 27 2018 22:23:21 EDT Ventricular Rate:  80 PR Interval:    QRS Duration: 99 QT  Interval:  342 QTC Calculation: 395 R Axis:   55 Text Interpretation:  Normal sinus rhythm Normal ECG Confirmed by Orpah Greek [phone]) on 08/28/2018 12:06:20 AM   Radiology Dg Chest 2 View  Result Date: 08/27/2018 CLINICAL DATA:  52 year old male with chest pain. EXAM: CHEST - 2 VIEW COMPARISON:  Chest radiograph dated 03/30/2011 FINDINGS: The lungs are clear. There is no pleural effusion or pneumothorax. The cardiac silhouette is within normal limits. No acute osseous pathology. Midthoracic compression deformity and vertebroplasty changes. IMPRESSION: No active cardiopulmonary disease. Electronically Signed   By: Anner Crete M.D.   On: 08/27/2018 22:57    Procedures Procedures (including critical care time)  Medications Ordered in ED Medications  iopamidol (ISOVUE-370) 76 % injection 100 mL (100 mLs Intravenous Contrast Given 08/28/18 0025)  Initial Impression / Assessment and Plan / ED Course  I have reviewed the triage vital signs and the nursing notes.  Pertinent labs & imaging results that were available during my care of the patient were reviewed by me and considered in my medical decision making (see chart for details).   CBC showed slight leukocytosis, 12.4, he remains afebrile. BMP showed no electrolyte abnormality, I have added a hepatic function panel to the blood sample. I-stat trop first set negative.  ACS/STEMI Pending upon arrival was 0.00.  His EKG was NSR with no signs of infarct or STEMI. He had a stress test done 4 years ago.  I have personally reviewed the chart to obtain this information.  Will order a second troponin to rule out any ACS as patient presents with atypical pain which he usually gets from his back surgery years ago.  Esophageal rupture  denies any vomiting, there is no crepitus sounds in his epigastric region.  Patient has bowel sounds throughout normal.  He reports eating sushi tonight without difficulty. PE Patient does complain of  "labored breathing" will order a CT PE study to r/o embolism as patient is a smoker.  Pericarditis his pain is not positional, he does not fever at this time, EKG showed no changes chest consistent with pericarditis. Pneumonia I believe this is less likely as patient does not report a fever, DG chest showed no consolidation, pneumothorax, no active cardiopulmonary disease.  Patients presents with chest pain which he describes as cramping beginning on Tuesday along with diaphoresis, hand numbness and tingling.  On examination the pain is not reproducible with palpation.  Plan: We will order a second troponin at 1 AM along with a CT PE studies to rule out any embolism.  I have discussed this patient with Dr. Betsey Holiday who has also seen this patient and evaluated him and agrees with my management.  She is to be discharged home as long as CT study returns normal with no embolism and second troponin remains negative.  Patient updated on this information.   Final Clinical Impressions(s) / ED Diagnoses   Final diagnoses:  Atypical chest pain    ED Discharge Orders    None       Janeece Fitting, PA-C 08/28/18 0051    Orpah Greek, MD 08/28/18 [phone]

## 2018-08-28 ENCOUNTER — Emergency Department (HOSPITAL_COMMUNITY): Payer: 59

## 2018-08-28 DIAGNOSIS — R0602 Shortness of breath: Secondary | ICD-10-CM | POA: Diagnosis not present

## 2018-08-28 LAB — HEPATIC FUNCTION PANEL
ALT: 13 U/L (ref 0–44)
AST: 17 U/L (ref 15–41)
Albumin: 3.8 g/dL (ref 3.5–5.0)
Alkaline Phosphatase: 68 U/L (ref 38–126)
BILIRUBIN DIRECT: 0.1 mg/dL (ref 0.0–0.2)
BILIRUBIN TOTAL: 0.6 mg/dL (ref 0.3–1.2)
Indirect Bilirubin: 0.5 mg/dL (ref 0.3–0.9)
Total Protein: 6.7 g/dL (ref 6.5–8.1)

## 2018-08-28 LAB — I-STAT TROPONIN, ED: TROPONIN I, POC: 0.01 ng/mL (ref 0.00–0.08)

## 2018-08-28 MED ORDER — IOPAMIDOL (ISOVUE-370) INJECTION 76%
100.0000 mL | Freq: Once | INTRAVENOUS | Status: AC | PRN
  Administered 2018-08-28: 100 mL via INTRAVENOUS

## 2018-08-28 MED ORDER — IOPAMIDOL (ISOVUE-370) INJECTION 76%
INTRAVENOUS | Status: AC
  Filled 2018-08-28: qty 100

## 2018-08-28 NOTE — Discharge Instructions (Addendum)
Please follow up with PCP as needed.You may alternate tylenol or ibuprofen for your pain. If you experience any chest pain, shortness of breath you may return to the ED for reevaluation.

## 2018-08-30 DIAGNOSIS — E7800 Pure hypercholesterolemia, unspecified: Secondary | ICD-10-CM | POA: Diagnosis not present

## 2018-08-30 DIAGNOSIS — E669 Obesity, unspecified: Secondary | ICD-10-CM | POA: Diagnosis not present

## 2018-08-30 DIAGNOSIS — F17200 Nicotine dependence, unspecified, uncomplicated: Secondary | ICD-10-CM | POA: Diagnosis not present

## 2018-09-01 DIAGNOSIS — E782 Mixed hyperlipidemia: Secondary | ICD-10-CM | POA: Diagnosis not present

## 2018-09-01 DIAGNOSIS — R079 Chest pain, unspecified: Secondary | ICD-10-CM | POA: Diagnosis not present

## 2018-09-01 DIAGNOSIS — R911 Solitary pulmonary nodule: Secondary | ICD-10-CM | POA: Diagnosis not present

## 2018-09-02 ENCOUNTER — Encounter: Payer: Self-pay | Admitting: Pulmonary Disease

## 2018-09-02 ENCOUNTER — Ambulatory Visit: Payer: 59 | Admitting: Pulmonary Disease

## 2018-09-02 VITALS — BP 124/80 | HR 76 | Ht 77.0 in | Wt 246.2 lb

## 2018-09-02 DIAGNOSIS — F17200 Nicotine dependence, unspecified, uncomplicated: Secondary | ICD-10-CM | POA: Diagnosis not present

## 2018-09-02 DIAGNOSIS — Z720 Tobacco use: Secondary | ICD-10-CM | POA: Diagnosis not present

## 2018-09-02 DIAGNOSIS — R918 Other nonspecific abnormal finding of lung field: Secondary | ICD-10-CM | POA: Diagnosis not present

## 2018-09-02 NOTE — Patient Instructions (Addendum)
Thank you for visiting Dr. Valeta Harms at [facility]. Today we recommend the following: Orders Placed This Encounter  Procedures  . CT Chest Wo Contrast  . Pulmonary function test   Return in about 6 months (around 03/03/2019).
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
## 2018-09-09 DIAGNOSIS — R0789 Other chest pain: Secondary | ICD-10-CM | POA: Diagnosis not present

## 2018-09-09 DIAGNOSIS — Z720 Tobacco use: Secondary | ICD-10-CM | POA: Diagnosis not present

## 2018-09-09 DIAGNOSIS — E785 Hyperlipidemia, unspecified: Secondary | ICD-10-CM | POA: Diagnosis not present

## 2018-09-12 DIAGNOSIS — Z720 Tobacco use: Secondary | ICD-10-CM | POA: Diagnosis not present

## 2018-09-12 DIAGNOSIS — R0789 Other chest pain: Secondary | ICD-10-CM | POA: Diagnosis not present

## 2018-09-16 ENCOUNTER — Ambulatory Visit (INDEPENDENT_AMBULATORY_CARE_PROVIDER_SITE_OTHER): Payer: 59 | Admitting: Pulmonary Disease

## 2018-09-16 DIAGNOSIS — R918 Other nonspecific abnormal finding of lung field: Secondary | ICD-10-CM

## 2018-09-16 LAB — PULMONARY FUNCTION TEST
DL/VA % pred: 63 %
DL/VA: 3.09 ml/min/mmHg/L
DLCO UNC: 28.26 ml/min/mmHg
DLCO cor % pred: 72 %
DLCO cor: 27.95 ml/min/mmHg
DLCO unc % pred: 72 %
FEF 25-75 Post: 5.12 L/sec
FEF 25-75 Pre: 4.23 L/sec
FEF2575-%Change-Post: 21 %
FEF2575-%Pred-Post: 129 %
FEF2575-%Pred-Pre: 107 %
FEV1-%CHANGE-POST: 4 %
FEV1-%PRED-PRE: 111 %
FEV1-%Pred-Post: 116 %
FEV1-PRE: 5.07 L
FEV1-Post: 5.3 L
FEV1FVC-%Change-Post: 2 %
FEV1FVC-%PRED-PRE: 97 %
FEV6-%Change-Post: 2 %
FEV6-%PRED-POST: 117 %
FEV6-%Pred-Pre: 115 %
FEV6-Post: 6.71 L
FEV6-Pre: 6.57 L
FEV6FVC-%CHANGE-POST: 0 %
FEV6FVC-%Pred-Post: 102 %
FEV6FVC-%Pred-Pre: 101 %
FVC-%Change-Post: 1 %
FVC-%PRED-PRE: 113 %
FVC-%Pred-Post: 115 %
FVC-PRE: 6.67 L
FVC-Post: 6.78 L
PRE FEV6/FVC RATIO: 98 %
Post FEV1/FVC ratio: 78 %
Post FEV6/FVC ratio: 99 %
Pre FEV1/FVC ratio: 76 %
RV % pred: 69 %
RV: 1.59 L
TLC % pred: 109 %
TLC: 8.67 L

## 2018-09-16 NOTE — Progress Notes (Signed)
PFT done today. 

## 2018-09-20 DIAGNOSIS — R079 Chest pain, unspecified: Secondary | ICD-10-CM | POA: Diagnosis not present

## 2018-09-20 DIAGNOSIS — R911 Solitary pulmonary nodule: Secondary | ICD-10-CM | POA: Diagnosis not present

## 2018-09-20 DIAGNOSIS — E782 Mixed hyperlipidemia: Secondary | ICD-10-CM | POA: Diagnosis not present

## 2018-10-02 DIAGNOSIS — S2232XA Fracture of one rib, left side, initial encounter for closed fracture: Secondary | ICD-10-CM | POA: Diagnosis not present

## 2018-10-02 DIAGNOSIS — R0781 Pleurodynia: Secondary | ICD-10-CM | POA: Diagnosis not present

## 2018-10-02 DIAGNOSIS — S2242XA Multiple fractures of ribs, left side, initial encounter for closed fracture: Secondary | ICD-10-CM | POA: Diagnosis not present

## 2018-10-31 DIAGNOSIS — E7800 Pure hypercholesterolemia, unspecified: Secondary | ICD-10-CM | POA: Diagnosis not present

## 2018-10-31 DIAGNOSIS — Z0000 Encounter for general adult medical examination without abnormal findings: Secondary | ICD-10-CM | POA: Diagnosis not present

## 2018-10-31 LAB — LIPID PANEL
Cholesterol: 160 (ref 0–200)
HDL: 29 — AB (ref 35–70)
LDL Cholesterol: 107
Triglycerides: 117 (ref 40–160)

## 2018-10-31 LAB — BASIC METABOLIC PANEL
BUN: 15 (ref 4–21)
Creatinine: 1 (ref 0.6–1.3)

## 2018-10-31 LAB — CBC AND DIFFERENTIAL
Hemoglobin: 15.4 (ref 13.5–17.5)
Platelets: 154 (ref 150–399)
WBC: 9

## 2018-10-31 LAB — CBC: RBC: 4.9 (ref 3.87–5.11)

## 2018-10-31 LAB — HEPATIC FUNCTION PANEL
ALT: 11 (ref 10–40)
AST: 10 — AB (ref 14–40)
Bilirubin, Total: 0.6

## 2018-11-12 DIAGNOSIS — H0015 Chalazion left lower eyelid: Secondary | ICD-10-CM | POA: Diagnosis not present

## 2018-11-16 DIAGNOSIS — H0015 Chalazion left lower eyelid: Secondary | ICD-10-CM | POA: Diagnosis not present

## 2018-11-16 DIAGNOSIS — H0288A Meibomian gland dysfunction right eye, upper and lower eyelids: Secondary | ICD-10-CM | POA: Diagnosis not present

## 2018-11-16 DIAGNOSIS — H0288B Meibomian gland dysfunction left eye, upper and lower eyelids: Secondary | ICD-10-CM | POA: Diagnosis not present

## 2018-12-07 DIAGNOSIS — K5790 Diverticulosis of intestine, part unspecified, without perforation or abscess without bleeding: Secondary | ICD-10-CM

## 2018-12-07 HISTORY — DX: Diverticulosis of intestine, part unspecified, without perforation or abscess without bleeding: K57.90

## 2019-02-15 ENCOUNTER — Telehealth: Payer: Self-pay | Admitting: Pulmonary Disease

## 2019-02-15 NOTE — Telephone Encounter (Signed)
Called and spoke with Patient.  PFT results from Dr Valeta Harms given. Patient stated understanding.  Nothing further at this time. Per Dr Valeta Harms- SPIROMETRY: Normal Airflows, no evidence of obstruction Bronchodilator response: No significant response to bronchodilator. DLCO: mildlly decreased diffusion capacity. LUNG VOLUMES: Normal Volumes (TLC), reduced RV

## 2019-02-15 NOTE — Telephone Encounter (Signed)
PCCM:  SPIROMETRY: Normal Airflows, no evidence of obstruction Bronchodilator response: No significant response to bronchodilator. DLCO: mildlly decreased diffusion capacity. LUNG VOLUMES: Normal Volumes (TLC), reduced RV  Thanks  [name], DO [facility] 02/15/2019 4:06 PM

## 2019-02-15 NOTE — Telephone Encounter (Signed)
Called and spoke with Patient.  Patient stated that he had PFT 09/2018, and never received results. Patient is requesting PFT results from Dr Valeta Harms.  Message routed to Dr Valeta Harms

## 2019-03-03 ENCOUNTER — Encounter (INDEPENDENT_AMBULATORY_CARE_PROVIDER_SITE_OTHER): Payer: Self-pay

## 2019-03-03 ENCOUNTER — Ambulatory Visit (INDEPENDENT_AMBULATORY_CARE_PROVIDER_SITE_OTHER)
Admission: RE | Admit: 2019-03-03 | Discharge: 2019-03-03 | Disposition: A | Discharge status: Home / Self Care | Payer: 59 | Source: Ambulatory Visit | Attending: Pulmonary Disease | Admitting: Pulmonary Disease

## 2019-03-03 ENCOUNTER — Other Ambulatory Visit: Payer: Self-pay

## 2019-03-03 DIAGNOSIS — R918 Other nonspecific abnormal finding of lung field: Secondary | ICD-10-CM | POA: Diagnosis not present

## 2019-03-03 DIAGNOSIS — R0602 Shortness of breath: Secondary | ICD-10-CM | POA: Diagnosis not present

## 2019-03-07 DIAGNOSIS — E7800 Pure hypercholesterolemia, unspecified: Secondary | ICD-10-CM | POA: Diagnosis not present

## 2019-03-07 DIAGNOSIS — E669 Obesity, unspecified: Secondary | ICD-10-CM | POA: Diagnosis not present

## 2019-03-07 DIAGNOSIS — F17200 Nicotine dependence, unspecified, uncomplicated: Secondary | ICD-10-CM | POA: Diagnosis not present

## 2019-03-07 LAB — LIPID PANEL
Cholesterol: 192 (ref 0–200)
HDL: 32 — AB (ref 35–70)
LDL Cholesterol: 128
Triglycerides: 159 (ref 40–160)

## 2019-03-08 ENCOUNTER — Other Ambulatory Visit: Payer: Self-pay | Admitting: Pulmonary Disease

## 2019-03-08 DIAGNOSIS — R918 Other nonspecific abnormal finding of lung field: Secondary | ICD-10-CM

## 2019-03-19 ENCOUNTER — Encounter (HOSPITAL_COMMUNITY): Payer: Self-pay

## 2019-03-19 ENCOUNTER — Emergency Department (HOSPITAL_COMMUNITY)
Admission: EM | Admit: 2019-03-19 | Discharge: 2019-03-19 | Disposition: A | Discharge status: Home / Self Care | Payer: 59 | Attending: Emergency Medicine | Admitting: Emergency Medicine

## 2019-03-19 ENCOUNTER — Other Ambulatory Visit: Payer: Self-pay

## 2019-03-19 ENCOUNTER — Emergency Department (HOSPITAL_COMMUNITY): Payer: 59

## 2019-03-19 DIAGNOSIS — S22080A Wedge compression fracture of T11-T12 vertebra, initial encounter for closed fracture: Secondary | ICD-10-CM | POA: Diagnosis not present

## 2019-03-19 DIAGNOSIS — Y92410 Unspecified street and highway as the place of occurrence of the external cause: Secondary | ICD-10-CM | POA: Diagnosis not present

## 2019-03-19 DIAGNOSIS — Y9355 Activity, bike riding: Secondary | ICD-10-CM | POA: Diagnosis not present

## 2019-03-19 DIAGNOSIS — Y999 Unspecified external cause status: Secondary | ICD-10-CM | POA: Diagnosis not present

## 2019-03-19 DIAGNOSIS — S22089A Unspecified fracture of T11-T12 vertebra, initial encounter for closed fracture: Secondary | ICD-10-CM | POA: Diagnosis not present

## 2019-03-19 DIAGNOSIS — S3991XA Unspecified injury of abdomen, initial encounter: Secondary | ICD-10-CM | POA: Diagnosis not present

## 2019-03-19 DIAGNOSIS — F17210 Nicotine dependence, cigarettes, uncomplicated: Secondary | ICD-10-CM | POA: Insufficient documentation

## 2019-03-19 DIAGNOSIS — R1030 Lower abdominal pain, unspecified: Secondary | ICD-10-CM | POA: Diagnosis not present

## 2019-03-19 DIAGNOSIS — S3992XA Unspecified injury of lower back, initial encounter: Secondary | ICD-10-CM | POA: Diagnosis present

## 2019-03-19 DIAGNOSIS — Z79899 Other long term (current) drug therapy: Secondary | ICD-10-CM | POA: Insufficient documentation

## 2019-03-19 DIAGNOSIS — R52 Pain, unspecified: Secondary | ICD-10-CM

## 2019-03-19 LAB — CBC WITH DIFFERENTIAL/PLATELET
Abs Immature Granulocytes: 0.11 10*3/uL — ABNORMAL HIGH (ref 0.00–0.07)
Basophils Absolute: 0 10*3/uL (ref 0.0–0.1)
Basophils Relative: 0 %
Eosinophils Absolute: 0 10*3/uL (ref 0.0–0.5)
Eosinophils Relative: 0 %
HCT: 47 % (ref 39.0–52.0)
Hemoglobin: 15.8 g/dL (ref 13.0–17.0)
Immature Granulocytes: 1 %
Lymphocytes Relative: 14 %
Lymphs Abs: 2.5 10*3/uL (ref 0.7–4.0)
MCH: 31 pg (ref 26.0–34.0)
MCHC: 33.6 g/dL (ref 30.0–36.0)
MCV: 92.2 fL (ref 80.0–100.0)
Monocytes Absolute: 0.9 10*3/uL (ref 0.1–1.0)
Monocytes Relative: 5 %
Neutro Abs: 14.2 10*3/uL — ABNORMAL HIGH (ref 1.7–7.7)
Neutrophils Relative %: 80 %
Platelets: 166 10*3/uL (ref 150–400)
RBC: 5.1 MIL/uL (ref 4.22–5.81)
RDW: 13 % (ref 11.5–15.5)
WBC: 17.8 10*3/uL — ABNORMAL HIGH (ref 4.0–10.5)
nRBC: 0 % (ref 0.0–0.2)

## 2019-03-19 LAB — COMPREHENSIVE METABOLIC PANEL
ALT: 21 U/L (ref 0–44)
AST: 23 U/L (ref 15–41)
Albumin: 4.3 g/dL (ref 3.5–5.0)
Alkaline Phosphatase: 91 U/L (ref 38–126)
Anion gap: 10 (ref 5–15)
BUN: 15 mg/dL (ref 6–20)
CO2: 26 mmol/L (ref 22–32)
Calcium: 9.8 mg/dL (ref 8.9–10.3)
Chloride: 105 mmol/L (ref 98–111)
Creatinine, Ser: 1.48 mg/dL — ABNORMAL HIGH (ref 0.61–1.24)
GFR calc Af Amer: >60 mL/min (ref >60)
GFR calc non Af Amer: 54 mL/min — ABNORMAL LOW (ref >60)
Glucose, Bld: 72 mg/dL (ref 70–99)
Potassium: 4.3 mmol/L (ref 3.5–5.1)
Sodium: 141 mmol/L (ref 135–145)
Total Bilirubin: 0.6 mg/dL (ref 0.3–1.2)
Total Protein: 6.6 g/dL (ref 6.5–8.1)

## 2019-03-19 LAB — URINALYSIS, ROUTINE W REFLEX MICROSCOPIC
Bilirubin Urine: NEGATIVE
Glucose, UA: NEGATIVE mg/dL
Hgb urine dipstick: NEGATIVE
Ketones, ur: NEGATIVE mg/dL
Leukocytes,Ua: NEGATIVE
Nitrite: NEGATIVE
Protein, ur: NEGATIVE mg/dL
Specific Gravity, Urine: 1.014 (ref 1.005–1.030)
pH: 5 (ref 5.0–8.0)

## 2019-03-19 MED ORDER — FENTANYL CITRATE (PF) 100 MCG/2ML IJ SOLN
50.0000 ug | Freq: Once | INTRAMUSCULAR | Status: AC
  Administered 2019-03-19: 50 ug via INTRAVENOUS
  Filled 2019-03-19: qty 2

## 2019-03-19 MED ORDER — OXYCODONE-ACETAMINOPHEN 5-325 MG PO TABS
1.0000 | ORAL_TABLET | Freq: Once | ORAL | Status: AC | PRN
  Administered 2019-03-19: 23:00:00 1 via ORAL
  Filled 2019-03-19: qty 1

## 2019-03-19 MED ORDER — ONDANSETRON HCL 4 MG/2ML IJ SOLN
4.0000 mg | Freq: Once | INTRAMUSCULAR | Status: AC
  Administered 2019-03-19: 18:00:00 4 mg via INTRAVENOUS
  Filled 2019-03-19: qty 2

## 2019-03-19 MED ORDER — OXYCODONE-ACETAMINOPHEN 5-325 MG PO TABS: 1.0000 | ORAL_TABLET | Freq: Four times a day (QID) | ORAL | 0 refills | Status: DC | PRN

## 2019-03-19 MED ORDER — IOHEXOL 300 MG/ML  SOLN
100.0000 mL | Freq: Once | INTRAMUSCULAR | Status: AC | PRN
  Administered 2019-03-19: 20:00:00 100 mL via INTRAVENOUS

## 2019-03-19 MED ORDER — FENTANYL CITRATE (PF) 100 MCG/2ML IJ SOLN
50.0000 ug | Freq: Once | INTRAMUSCULAR | Status: AC
  Administered 2019-03-19: 21:00:00 50 ug via INTRAVENOUS
  Filled 2019-03-19: qty 2

## 2019-03-19 NOTE — Discharge Instructions (Addendum)
You can take the brace off the shower and when laying flat.  Today you received medications that may make you sleepy or impair your ability to make decisions.  For the next 24 hours please do not drive, operate heavy machinery, care for a small child with out another adult present, or perform any activities that may cause harm to you or someone else if you were to fall asleep or be impaired.   You are being prescribed a medication which may make you sleepy. Please follow up of listed precautions for at least 24 hours after taking one dose.  Please take Ibuprofen (Advil, motrin) and Tylenol (acetaminophen) to relieve your pain.  You may take up to 600 MG (3 pills) of normal strength ibuprofen every 8 hours as needed.  In between doses of ibuprofen you make take tylenol, up to 1,000 mg (two extra strength pills).  Do not take more than 3,000 mg tylenol in a 24 hour period.  Please check all medication labels as many medications such as pain and cold medications may contain tylenol.  Do not drink alcohol while taking these medications.  Do not take other NSAID'S while taking ibuprofen (such as aleve or naproxen).  Please take ibuprofen with food to decrease stomach upset.

## 2019-03-19 NOTE — ED Notes (Signed)
Patient transported to CT 
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
## 2019-03-19 NOTE — ED Notes (Signed)
Spoke with the ortho tech about ordering the TLSO brace for this patient. Informed it would take a few hours to receive the brace. This RN informed the patient about the expected wait time for the brace.
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
## 2019-03-20 NOTE — ED Notes (Signed)
41mcg of fentanyl wasted with Ryland Beatris Si.

## 2019-03-24 ENCOUNTER — Other Ambulatory Visit: Payer: Self-pay | Admitting: Cardiology

## 2019-03-24 NOTE — Telephone Encounter (Signed)
Please fill

## 2019-04-12 DIAGNOSIS — S22080A Wedge compression fracture of T11-T12 vertebra, initial encounter for closed fracture: Secondary | ICD-10-CM | POA: Diagnosis not present

## 2019-04-12 DIAGNOSIS — M546 Pain in thoracic spine: Secondary | ICD-10-CM | POA: Diagnosis not present

## 2019-09-07 LAB — HEPATIC FUNCTION PANEL
ALT: 13 (ref 10–40)
AST: 13 — AB (ref 14–40)
Bilirubin, Total: 0.5

## 2019-09-08 ENCOUNTER — Other Ambulatory Visit: Payer: Self-pay

## 2019-09-08 ENCOUNTER — Ambulatory Visit (INDEPENDENT_AMBULATORY_CARE_PROVIDER_SITE_OTHER)
Admission: RE | Admit: 2019-09-08 | Discharge: 2019-09-08 | Disposition: A | Discharge status: Home / Self Care | Payer: 59 | Source: Ambulatory Visit | Attending: Pulmonary Disease | Admitting: Pulmonary Disease

## 2019-09-08 DIAGNOSIS — R918 Other nonspecific abnormal finding of lung field: Secondary | ICD-10-CM

## 2019-09-12 ENCOUNTER — Telehealth: Payer: Self-pay | Admitting: Pulmonary Disease

## 2019-09-12 DIAGNOSIS — R918 Other nonspecific abnormal finding of lung field: Secondary | ICD-10-CM

## 2019-09-12 NOTE — Telephone Encounter (Signed)
lmtcb for pt.    Notes recorded by [name], DO on 09/12/2019 at 10:42 AM EDT  [place], You can let him know that he still has multiple stable 2 to 4 mm sized lung nodules. Recommend 35-month follow-up CT noncontrast. Please schedule follow-up with APP to review ct results in 12 months   Thanks,   BLI

## 2019-09-12 NOTE — Progress Notes (Signed)
See phone note. Will sign off.

## 2019-09-13 NOTE — Telephone Encounter (Signed)
LMTCB x2 for pt 

## 2019-09-13 NOTE — Telephone Encounter (Signed)
Spoke with the pt and notified of recs per Dr Valeta Harms  He verbalized understanding  Order placed for reminder ct chest for 1 year

## 2019-09-13 NOTE — Telephone Encounter (Signed)
Pt returning call.  [phone].  At lunch until 2:00.
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
## 2020-03-18 ENCOUNTER — Encounter: Payer: Self-pay | Admitting: Family Medicine

## 2020-03-19 ENCOUNTER — Ambulatory Visit: Payer: 59 | Admitting: Family Medicine

## 2020-03-19 ENCOUNTER — Encounter: Payer: Self-pay | Admitting: Family Medicine

## 2020-03-19 ENCOUNTER — Other Ambulatory Visit: Payer: Self-pay

## 2020-03-19 VITALS — BP 111/76 | HR 74 | Temp 98.3°F | Resp 18 | Ht 75.0 in | Wt 252.0 lb

## 2020-03-19 DIAGNOSIS — J439 Emphysema, unspecified: Secondary | ICD-10-CM

## 2020-03-19 DIAGNOSIS — I7090 Unspecified atherosclerosis: Secondary | ICD-10-CM | POA: Insufficient documentation

## 2020-03-19 DIAGNOSIS — B078 Other viral warts: Secondary | ICD-10-CM | POA: Insufficient documentation

## 2020-03-19 DIAGNOSIS — F411 Generalized anxiety disorder: Secondary | ICD-10-CM | POA: Diagnosis not present

## 2020-03-19 DIAGNOSIS — R918 Other nonspecific abnormal finding of lung field: Secondary | ICD-10-CM

## 2020-03-19 DIAGNOSIS — Z79899 Other long term (current) drug therapy: Secondary | ICD-10-CM | POA: Diagnosis not present

## 2020-03-19 DIAGNOSIS — E782 Mixed hyperlipidemia: Secondary | ICD-10-CM | POA: Insufficient documentation

## 2020-03-19 DIAGNOSIS — E669 Obesity, unspecified: Secondary | ICD-10-CM | POA: Insufficient documentation

## 2020-03-19 MED ORDER — SERTRALINE HCL 100 MG PO TABS: 100.0000 mg | ORAL_TABLET | Freq: Every day | ORAL | 1 refills | Status: DC

## 2020-03-19 MED ORDER — ATORVASTATIN CALCIUM 40 MG PO TABS: 40.0000 mg | ORAL_TABLET | Freq: Every day | ORAL | 3 refills | Status: DC

## 2020-03-19 MED ORDER — CLONAZEPAM 1 MG PO TABS: 1.0000 mg | ORAL_TABLET | Freq: Every day | ORAL | 1 refills | Status: DC

## 2020-03-19 MED ORDER — SERTRALINE HCL 100 MG PO TABS: 100.0000 mg | ORAL_TABLET | Freq: Every day | ORAL | 11 refills | Status: DC

## 2020-03-19 NOTE — Patient Instructions (Addendum)
Pleasure to meet you today.  I have refilled your zoloft and klonopin. Follow up in 5.5 months on these conditions before needing refills.  Physical in 1-2 months> please call to schedule.   COVID-19 Vaccine Information can be found at: [email or web address] For questions related to vaccine distribution or appointments, please email [email or web address] or call [phone].  Covid Vaccine appointment go to [email or web address].    Please help [place] help you:  We are honored you have chosen [facility] for your Primary Care home. Below you will find basic instructions that you may need to access in the future. Please help [place] help you by reading the instructions, which cover many of the frequent questions we experience.   Prescription refills and request:  -In order to allow more efficient response time, please call your pharmacy for all refills. They will forward the request electronically to [place]. This allows for the quickest possible response. Request left on a nurse line can take longer to refill, since these are checked as time allows between office patients and other phone calls.  - refill request can take up to 3-5 working days to complete.  - If request is sent electronically and request is appropiate, it is usually completed in 1-2 business days.  - all patients will need to be seen routinely for all chronic medical conditions requiring prescription medications (see follow-up below). If you are overdue for follow up on your condition, you will be asked to make an appointment and we will call in enough medication to cover you until your appointment (up to 30 days).  - all controlled substances will require a face to face visit to request/refill.  - if you desire your prescriptions to go through a new pharmacy, and have an active script at original pharmacy, you will need to call your pharmacy and have scripts transferred to  new pharmacy. This is completed between the pharmacy locations and not by your provider.    Results: Our office handles many outgoing and incoming calls daily. If we have not contacted you within 1 week about your results, please check your mychart to see if there is a message first and if not, then contact our office.  In helping with this matter, you help decrease call volume, and therefore allow [place] to be able to respond to patients needs more efficiently.  We will always attempt to call you with results,  normal or abnormal. However, if we are unable to reach you we will send a message in your my chart with results.   Acute office visits (sick visit):  An acute visit is intended for a new problem and are scheduled in shorter time slots to allow schedule openings for patients with new problems. This is the appropriate visit to discuss a new problem. Problems will not be addressed by phone call or Echart message. Appointment is needed if requesting treatment. In order to provide you with excellent quality medical care with proper time for you to explain your problem, have an exam and receive treatment with instructions, these appointments should be limited to one new problem per visit. If you experience a new problem, in which you desire to be addressed, please make an acute office visit, we save openings on the schedule to accommodate you. Please do not save your new problem for any other type of visit, let us take care of it properly and quickly for you.   Follow up visits:  Depending on your condition(s) your provider will need to see  you routinely in order to provide you with quality care and prescribe medication(s). Most chronic conditions (Example: hypertension, Diabetes, depression/anxiety... etc), require visits a couple times a year. Your provider will instruct you on proper follow up for your personal medical conditions and history. Please make certain to make follow up appointments for your  condition as instructed. Failing to do so could result in lapse in your medication treatment/refills. If you request a refill, and are overdue to be seen on a condition, we will always provide you with a 30 day script (once) to allow you time to schedule.    Medicare wellness (well visit): - we have a wonderful Nurse Maudie Mercury), that will meet with you and provide you will yearly medicare wellness visits. These visits should occur yearly (can not be scheduled less than 1 calendar year apart) and cover preventive health, immunizations, advance directives and screenings you are entitled to yearly through your medicare benefits. Do not miss out on your entitled benefits, this is when medicare will pay for these benefits to be ordered for you.  These are strongly encouraged by your provider and is the appropriate type of visit to make certain you are up to date with all preventive health benefits. If you have not had your medicare wellness exam in the last 12 months, please make certain to schedule one by calling the office and schedule your medicare wellness with Maudie Mercury as soon as possible.   Yearly physical (well visit):  - Adults are recommended to be seen yearly for physicals. Check with your insurance and date of your last physical, most insurances require one calendar year between physicals. Physicals include all preventive health topics, screenings, medical exam and labs that are appropriate for gender/age and history. You may have fasting labs needed at this visit. This is a well visit (not a sick visit), new problems should not be covered during this visit (see acute visit).  - Pediatric patients are seen more frequently when they are younger. Your provider will advise you on well child visit timing that is appropriate for your their age. - This is not a medicare wellness visit. Medicare wellness exams do not have an exam portion to the visit. Some medicare companies allow for a physical, some do not allow a  yearly physical. If your medicare allows a yearly physical you can schedule the medicare wellness with our nurse Maudie Mercury and have your physical with your provider after, on the same day. Please check with insurance for your full benefits.   Late Policy/No Shows:  - all new patients should arrive 15-30 minutes earlier than appointment to allow [place] time  to  obtain all personal demographics,  insurance information and for you to complete office paperwork. - All established patients should arrive 10-15 minutes earlier than appointment time to update all information and be checked in .  - In our best efforts to run on time, if you are late for your appointment you will be asked to either reschedule or if able, we will work you back into the schedule. There will be a wait time to work you back in the schedule,  depending on availability.  - If you are unable to make it to your appointment as scheduled, please call 24 hours ahead of time to allow [place] to fill the time slot with someone else who needs to be seen. If you do not cancel your appointment ahead of time, you may be charged a no show fee.

## 2020-03-19 NOTE — Progress Notes (Signed)
Patient ID: Joseph Ditman., male  DOB: 1966-05-02, 54 y.o.   MRN: [id number] Patient Care Team    Relationship Specialty Notifications Start End  Ma Hillock, DO PCP - General Family Medicine  03/19/20   Jovita Gamma, MD Consulting Physician Neurosurgery  03/19/20   Tsamis, Constance Haw, MD Referring Physician Ophthalmology  03/19/20   [name], DO Consulting Physician Pulmonary Disease  03/19/20   Hamilton Capri Doroteo Bradford, MD Referring Physician Internal Medicine  03/19/20     Chief Complaint  Patient presents with  . Establish Care    Previous [facility] [employer]. Pt has growth on L hip that has been there a couple of months. Itches and burns but not painful. No drainage.   . Depression  . Anxiety    Subjective:  Joseph Etchells. is a 54 y.o.  male present for new patient establishment. All past medical history, surgical history, allergies, family history, immunizations, medications and social history were updated in the electronic medical record today. All recent labs, ED visits and hospitalizations within the last year were reviewed.  Left buttock lesion: Patient reports he noticed approximately 1-2 months ago a itchy raised area near his left buttock region.  He reports the lesion appears dark and he would like this looked at today.  He denies any drainage or bleeding of the area.  He states it is more irritating especially when riding his motorcycle his seat pushes on this area.  He denies any history of skin cancers or family history of skin cancers.  anxiety: Patient has been treated for his depression and anxiety with Zoloft and Klonopin dating back as far as 2000.  He is currently prescribed Zoloft 100 mg daily and Klonopin 1 mg nightly.  He reports this regimen works well for him. Indication for controlled substance: Anxiety Medication and dose: Klonopin 1 mg nightly # pills per month: 30 pills/month-73-month prescription at a time. Last UDS date:  Completed today Opioid Treatment Agreement signed (Y/N): Yes, completed today N[facility] reviewed this encounter (include red flags): Yes, 03/19/20   Mixed hyperlipidemia Patient reports compliance with Lipitor 40 mg daily.  He is due for his physical.  Multiple pulmonary nodules/Pulmonary emphysema, unspecified emphysema type (HCC)/smoker Patient is a current everyday smoker.  Greater than 30-pack-year history.  Presently established with pulmonology for follow-up on pulmonary nodules and emphysema.  Last CT 09/2019 favoring benign nodules at that time.     Depression screen [facility] 2/9 03/19/2020  Decreased Interest 0  Down, Depressed, Hopeless 0  PHQ - 2 Score 0  Altered sleeping 0  Tired, decreased energy 0  Change in appetite 0  Feeling bad or failure about yourself  0  Trouble concentrating 0  Moving slowly or fidgety/restless 0  Suicidal thoughts 0  PHQ-9 Score 0  Difficult doing work/chores Not difficult at all   GAD 7 : Generalized Anxiety Score 03/19/2020  Nervous, Anxious, on Edge 0  Control/stop worrying 0  Worry too much - different things 0  Trouble relaxing 0  Restless 0  Easily annoyed or irritable 0  Afraid - awful might happen 0  Total GAD 7 Score 0  Anxiety Difficulty Not difficult at all       No flowsheet data found.  Immunization History  Administered Date(s) Administered  . Tdap 12/30/2010    No exam data present  Past Medical History:  Diagnosis Date  . Allergy   . Anxiety   . Chicken pox   .  Compression fracture of body of thoracic vertebra (HCC)    t12  . Depression   . Diverticulosis 2020   incidental find CT. Sigmoid.   Marland Kitchen Fx femur shaft-closed ([place]) 03/20/2012   right.   . History of migraine   . HLD (hyperlipidemia)    Allergies  Allergen Reactions  . Codeine Other (See Comments), Hives and Rash   Past Surgical History:  Procedure Laterality Date  . FEMUR CLOSED REDUCTION Right    pin  . KYPHOPLASTY     T8   Family History    Problem Relation Age of Onset  . Hyperlipidemia Mother   . Breast cancer Mother   . Early death Father   . Heart attack Father   . Pancreatic cancer Father   . Alcohol abuse Brother   . Early death Brother   . Heart attack Brother   . Arthritis Maternal Grandmother   . Cancer Maternal Grandfather   . Hearing loss Maternal Grandfather   . Stroke Paternal Grandfather   . Heart attack Paternal Grandfather   . Colon cancer Paternal Uncle    Social History   Social History Narrative   Marital status/children/pets: Single   Education/employment: Secretary/administrator educated, employed as a Leisure centre manager:      -Wears a bicycle helmet riding a bike: Yes     -smoke alarm in the home:Yes     - wears seatbelt: Yes     - Feels safe in their relationships: Yes    Allergies as of 03/19/2020      Reactions   Codeine Other (See Comments), Hives, Rash      Medication List       Accurate as of March 19, 2020 12:56 PM. If you have any questions, ask your nurse or doctor.        STOP taking these medications   oxyCODONE-acetaminophen 5-325 MG tablet Commonly known as: PERCOCET/ROXICET Stopped by: Howard Pouch, DO     TAKE these medications   atorvastatin 40 MG tablet Commonly known as: LIPITOR Take 1 tablet (40 mg total) by mouth at bedtime.   clonazePAM 1 MG tablet Commonly known as: KLONOPIN Take 1 tablet (1 mg total) by mouth at bedtime.   sertraline 100 MG tablet Commonly known as: ZOLOFT Take 1 tablet (100 mg total) by mouth daily. What changed: when to take this       All past medical history, surgical history, allergies, family history, immunizations andmedications were updated in the EMR today and reviewed under the history and medication portions of their EMR.    No results found for this or any previous visit (from the past 2160 hour(s)).   ROS: 14 pt review of systems performed and negative (unless mentioned in an HPI)  Objective: BP 111/76 (BP Location: Left  Arm, Patient Position: Sitting, Cuff Size: Normal)   Pulse 74   Temp 98.3 F (36.8 C) (Temporal)   Resp 18   Ht 6\' 3"  (1.905 m)   Wt 252 lb (114.3 kg)   SpO2 98%   BMI 31.50 kg/m  Gen: Afebrile. No acute distress. Nontoxic in appearance, well-developed, well-nourished, male. HENT: AT. [place].  No cough on exam. Eyes:Pupils Equal Round Reactive to light, Extraocular movements intact,  Conjunctiva without redness, discharge or icterus. CV: RRR no murmur, no edema Chest: CTAB, no wheeze, rhonchi or crackles.  Abd: Soft. BS present.  Skin: Warm and well-perfused. Skin intact. ~1 cm oval raised rough mildly pigmented lesion left upper buttock region.  No drainage, no bleeding.  Neuro/Msk:  Normal gait. PERLA. EOMi. Alert. Oriented x3.  Psych: Normal affect, dress and demeanor. Normal speech. Normal thought content and judgment.  Assessment/plan: Joseph Conn. is a 54 y.o. male present for est care w/acute complaint and [facility] GAD (generalized anxiety disorder)/Controlled substance agreement signed/Chronic prescription benzodiazepine use Stable on current regimen. -Continue Zoloft -[place] controlled substance database reviewed today and appropriate -Chronic controlled substance agreement signed today. -UDS collected today. -Patient aware of her controlled substance agreement.  He must present face-to-face prior to needing refills every 5-1/2 months to continue Klonopin prescription.  Other viral warts Lesion appears verrucous versus SK. - Ambulatory referral to Dermatology  Mixed hyperlipidemia Continue atorvastatin 40 mg daily  Multiple pulmonary nodules/Pulmonary emphysema, unspecified emphysema type (HCC)/current smoker Continue follow-up routinely with pulmonology.  Reviewed recent CT 09/2019 which favors benign etiology. Will cover smoking cessation in more detail at upcoming physical.   Return in about 4 weeks (around 04/16/2020) for CPE (30  min).  Orders Placed This Encounter  Procedures  . Pain Mgmt, Profile 8 w/Conf, U  . Ambulatory referral to Dermatology   Meds ordered this encounter  Medications  . DISCONTD: sertraline (ZOLOFT) 100 MG tablet    Sig: Take 1 tablet (100 mg total) by mouth daily.    Dispense:  30 tablet    Refill:  11  . sertraline (ZOLOFT) 100 MG tablet    Sig: Take 1 tablet (100 mg total) by mouth daily.    Dispense:  90 tablet    Refill:  1    Please DC all other scripts by other providers and prior 30d script prescribed. Thank you.  . clonazePAM (KLONOPIN) 1 MG tablet    Sig: Take 1 tablet (1 mg total) by mouth at bedtime.    Dispense:  90 tablet    Refill:  1    Please DC all scripts of this med by other providers. Change in providers. Thanks.  Marland Kitchen atorvastatin (LIPITOR) 40 MG tablet    Sig: Take 1 tablet (40 mg total) by mouth at bedtime.    Dispense:  90 tablet    Refill:  3    DC all scripts by other providers. Thanks.    Referral Orders     Ambulatory referral to Dermatology   Note is dictated utilizing voice recognition software. Although note has been proof read prior to signing, occasional typographical errors still can be missed. If any questions arise, please do not hesitate to call for verification.  Electronically signed by: Howard Pouch, DO [place]

## 2020-03-20 LAB — PAIN MGMT, PROFILE 8 W/CONF, U
6 Acetylmorphine: NEGATIVE ng/mL
Alcohol Metabolites: NEGATIVE ng/mL (ref <500)
Amphetamines: NEGATIVE ng/mL
Benzodiazepines: NEGATIVE ng/mL
Buprenorphine, Urine: NEGATIVE ng/mL
Cocaine Metabolite: NEGATIVE ng/mL
Creatinine: 71.2 mg/dL
MDMA: NEGATIVE ng/mL
Marijuana Metabolite: NEGATIVE ng/mL
Opiates: NEGATIVE ng/mL
Oxidant: NEGATIVE ug/mL
Oxycodone: NEGATIVE ng/mL
pH: 5 (ref 4.5–9.0)

## 2020-04-03 ENCOUNTER — Encounter: Payer: Self-pay | Admitting: Family Medicine

## 2020-04-04 ENCOUNTER — Encounter: Payer: Self-pay | Admitting: Family Medicine

## 2020-05-20 ENCOUNTER — Other Ambulatory Visit: Payer: Self-pay

## 2020-05-20 ENCOUNTER — Encounter: Payer: Self-pay | Admitting: Physician Assistant

## 2020-05-20 ENCOUNTER — Ambulatory Visit: Payer: 59 | Admitting: Physician Assistant

## 2020-05-20 DIAGNOSIS — Z808 Family history of malignant neoplasm of other organs or systems: Secondary | ICD-10-CM

## 2020-05-20 DIAGNOSIS — L570 Actinic keratosis: Secondary | ICD-10-CM | POA: Diagnosis not present

## 2020-05-20 DIAGNOSIS — D485 Neoplasm of uncertain behavior of skin: Secondary | ICD-10-CM | POA: Diagnosis not present

## 2020-05-20 NOTE — Progress Notes (Signed)
   New Patient Visit  Subjective  Joseph Harding. is a 54 y.o. male who presents for the following: Warts (on tailbone x months- wants removed, right arm- ? ingrown hair). Bump on tailbone which has been there four months he noticed and PCP wanted him to be seen here. In the beginning it was itching. He drives a lot for work and so it has been bothersome with sitting on it. Bump on right forearm which has been there for 2 years. PCP at that time thought it was an ingrown hair. It used to burn but it doesn't hurt anymore. Spot on left ear that feels like sandpaper which has been there 1-2 years. It isn't sore and hasn't bled. Sister has had a skin cancer. Pt has no personal history of skin cancer   Objective  Well appearing patient in no apparent distress; mood and affect are within normal limits.  A focused examination was performed including back. arms, buttocks, chest, ears.. Relevant physical exam findings are noted in the Assessment and Plan. No suspicious moles noted on back.  Objective  Right Forearm - Anterior: Pink irregular papule     Objective  Left Hip (side) - Posterior: Keratotic papule     Objective  Left Triangular Fossa: Erythematous patches with gritty scale.  Assessment & Plan  Neoplasm of uncertain behavior of skin (2) Right Forearm - Anterior  Skin / nail biopsy Type of biopsy: tangential   Informed consent: discussed and consent obtained   Timeout: patient name, date of birth, surgical site, and procedure verified   Procedure prep:  Patient was prepped and draped in usual sterile fashion (Non sterile) Prep type:  Chlorhexidine Anesthesia: the lesion was anesthetized in a standard fashion   Anesthetic:  1% lidocaine w/ epinephrine 1-100,000 local infiltration Instrument used: flexible razor blade   Outcome: patient tolerated procedure well   Post-procedure details: wound care instructions given    Specimen 1 - Surgical pathology Differential  Diagnosis: r/o atypia Check Margins: No  Left Hip (side) - Posterior  Skin / nail biopsy Type of biopsy: tangential   Informed consent: discussed and consent obtained   Timeout: patient name, date of birth, surgical site, and procedure verified   Procedure prep:  Patient was prepped and draped in usual sterile fashion (Non sterile) Prep type:  Chlorhexidine Anesthesia: the lesion was anesthetized in a standard fashion   Anesthetic:  1% lidocaine w/ epinephrine 1-100,000 local infiltration Instrument used: flexible razor blade   Outcome: patient tolerated procedure well   Post-procedure details: wound care instructions given    Specimen 2 - Surgical pathology Differential Diagnosis: r/o atypia, sk, wart Check Margins: No  AK (actinic keratosis) Left Triangular Fossa  Destruction of lesion - Left Triangular Fossa Complexity: simple   Destruction method: cryotherapy   Informed consent: discussed and consent obtained   Timeout:  patient name, date of birth, surgical site, and procedure verified Lesion destroyed using liquid nitrogen: Yes   Outcome: patient tolerated procedure well with no complications   Post-procedure details: wound care instructions given

## 2020-07-15 ENCOUNTER — Encounter: Payer: 59 | Admitting: Family Medicine

## 2020-07-18 ENCOUNTER — Encounter: Payer: Self-pay | Admitting: Gastroenterology

## 2020-07-18 ENCOUNTER — Other Ambulatory Visit: Payer: Self-pay

## 2020-07-18 ENCOUNTER — Encounter: Payer: Self-pay | Admitting: Family Medicine

## 2020-07-18 ENCOUNTER — Ambulatory Visit (INDEPENDENT_AMBULATORY_CARE_PROVIDER_SITE_OTHER): Payer: 59 | Admitting: Family Medicine

## 2020-07-18 VITALS — BP 110/71 | HR 74 | Ht 75.0 in | Wt 242.6 lb

## 2020-07-18 DIAGNOSIS — E782 Mixed hyperlipidemia: Secondary | ICD-10-CM

## 2020-07-18 DIAGNOSIS — F411 Generalized anxiety disorder: Secondary | ICD-10-CM | POA: Diagnosis not present

## 2020-07-18 DIAGNOSIS — R918 Other nonspecific abnormal finding of lung field: Secondary | ICD-10-CM

## 2020-07-18 DIAGNOSIS — R634 Abnormal weight loss: Secondary | ICD-10-CM | POA: Diagnosis not present

## 2020-07-18 DIAGNOSIS — R194 Change in bowel habit: Secondary | ICD-10-CM

## 2020-07-18 DIAGNOSIS — J439 Emphysema, unspecified: Secondary | ICD-10-CM

## 2020-07-18 DIAGNOSIS — Z79899 Other long term (current) drug therapy: Secondary | ICD-10-CM

## 2020-07-18 DIAGNOSIS — R197 Diarrhea, unspecified: Secondary | ICD-10-CM

## 2020-07-18 DIAGNOSIS — F17200 Nicotine dependence, unspecified, uncomplicated: Secondary | ICD-10-CM

## 2020-07-18 LAB — CBC WITH DIFFERENTIAL/PLATELET
Basophils Absolute: 0.1 10*3/uL (ref 0.0–0.1)
Basophils Relative: 0.8 % (ref 0.0–3.0)
Eosinophils Absolute: 0.1 10*3/uL (ref 0.0–0.7)
Eosinophils Relative: 1.3 % (ref 0.0–5.0)
HCT: 45.8 % (ref 39.0–52.0)
Hemoglobin: 15.9 g/dL (ref 13.0–17.0)
Lymphocytes Relative: 33.7 % (ref 12.0–46.0)
Lymphs Abs: 2.8 10*3/uL (ref 0.7–4.0)
MCHC: 34.7 g/dL (ref 30.0–36.0)
MCV: 91.7 fl (ref 78.0–100.0)
Monocytes Absolute: 0.5 10*3/uL (ref 0.1–1.0)
Monocytes Relative: 5.4 % (ref 3.0–12.0)
Neutro Abs: 4.9 10*3/uL (ref 1.4–7.7)
Neutrophils Relative %: 58.8 % (ref 43.0–77.0)
Platelets: 154 10*3/uL (ref 150.0–400.0)
RBC: 4.99 Mil/uL (ref 4.22–5.81)
RDW: 14.1 % (ref 11.5–15.5)
WBC: 8.4 10*3/uL (ref 4.0–10.5)

## 2020-07-18 LAB — LIPID PANEL
Cholesterol: 176 mg/dL (ref 0–200)
HDL: 33.6 mg/dL — ABNORMAL LOW (ref >39.00)
LDL Cholesterol: 117 mg/dL — ABNORMAL HIGH (ref 0–99)
NonHDL: 142.68
Total CHOL/HDL Ratio: 5
Triglycerides: 129 mg/dL (ref 0.0–149.0)
VLDL: 25.8 mg/dL (ref 0.0–40.0)

## 2020-07-18 LAB — COMPREHENSIVE METABOLIC PANEL
ALT: 10 U/L (ref 0–53)
AST: 12 U/L (ref 0–37)
Albumin: 4.5 g/dL (ref 3.5–5.2)
Alkaline Phosphatase: 99 U/L (ref 39–117)
BUN: 11 mg/dL (ref 6–23)
CO2: 27 mEq/L (ref 19–32)
Calcium: 9.8 mg/dL (ref 8.4–10.5)
Chloride: 105 mEq/L (ref 96–112)
Creatinine, Ser: 1.13 mg/dL (ref 0.40–1.50)
GFR: 68.23 mL/min (ref >60.00)
Glucose, Bld: 96 mg/dL (ref 70–99)
Potassium: 4.4 mEq/L (ref 3.5–5.1)
Sodium: 139 mEq/L (ref 135–145)
Total Bilirubin: 0.6 mg/dL (ref 0.2–1.2)
Total Protein: 7.2 g/dL (ref 6.0–8.3)

## 2020-07-18 LAB — HEMOGLOBIN A1C: Hgb A1c MFr Bld: 5.8 % (ref 4.6–6.5)

## 2020-07-18 LAB — TSH: TSH: 1.62 u[IU]/mL (ref 0.35–4.50)

## 2020-07-18 LAB — PSA: PSA: 0.34 ng/mL (ref 0.10–4.00)

## 2020-07-18 MED ORDER — ATORVASTATIN CALCIUM 40 MG PO TABS: 40.0000 mg | ORAL_TABLET | Freq: Every day | ORAL | 3 refills | Status: DC

## 2020-07-18 MED ORDER — CLONAZEPAM 1 MG PO TABS: 1.0000 mg | ORAL_TABLET | Freq: Every day | ORAL | 1 refills | Status: DC

## 2020-07-18 MED ORDER — DICYCLOMINE HCL 10 MG PO CAPS: 10.0000 mg | ORAL_CAPSULE | Freq: Three times a day (TID) | ORAL | 1 refills | Status: DC

## 2020-07-18 MED ORDER — SERTRALINE HCL 100 MG PO TABS: 100.0000 mg | ORAL_TABLET | Freq: Every day | ORAL | 1 refills | Status: DC

## 2020-07-18 NOTE — Patient Instructions (Addendum)
Call Dr. Valeta Harms to get on their schedule October.  I have refilled your meds for you.   We will call you with lab results.  I have referred you to gastroenterology for your GI issues and unintentional weight loss.    Follow up in 5.5 mos for chronic conditions- sooner if needed.

## 2020-07-18 NOTE — Progress Notes (Signed)
Patient ID: Joseph Smigiel., male  DOB: [birth year], 54 y.o.   MRN: [id number] Patient Care Team    Relationship Specialty Notifications Start End  Ma Hillock, DO PCP - General Family Medicine  03/19/20   Jovita Gamma, MD Consulting Physician Neurosurgery  03/19/20   Tsamis, Constance Haw, MD Referring Physician Ophthalmology  03/19/20   [name], DO Consulting Physician Pulmonary Disease  03/19/20   Hamilton Capri Doroteo Bradford, MD Referring Physician Internal Medicine  03/19/20     Chief Complaint  Patient presents with  . Annual Exam  . GI issies    Subjective:  Joseph Reddy. is a 54 y.o.  male present for  anxiety: Patient has been treated for his depression and anxiety with Zoloft and Klonopin dating back as far as 2000.  He is currently prescribed Zoloft 100 mg daily and Klonopin 1 mg nightly.  He reports this regimen is working well for him.  Indication for controlled substance: Anxiety Medication and dose: Klonopin 1 mg nightly # pills per month: 30 pills/month-53-monthprescription at a time. Last UDS date: UTD 03/2020 Opioid Treatment Agreement signed (Y/N): Yes NCCS[place] reviewed this encounter (include red flags): Yes, 07/22/20   Mixed hyperlipidemia Patient reports compliance with Lipitor 40 mg daily.   Multiple pulmonary nodules/Pulmonary emphysema, unspecified emphysema type (HCC)/smoker Patient is a current everyday smoker.  Greater than 30-pack-year history.  Presently established with pulmonology for follow-up on pulmonary nodules and emphysema.  Last CT 09/2019 favoring benign nodules at that time.  Unintentional weight loss: Pt reports he has about 10 lbs of unintentional weight loss. He has been nauseated after eating and endorses watery diarrhea for the last few months. He states he no longer is experiencing the nausea. He has diarrhea about 2-3 week now with normal movement between. He reports [place] intestinal [place]  He denies fever, chills, night sweats. He is tolerating PO. He has never had a colon cancer screening.     [facility]  Decreased Interest 0  Down, Depressed, Hopeless 0  PHQ - 2 Score 0  Altered sleeping 0  Tired, decreased energy 0  Change in appetite 0  Feeling bad or failure about yourself  0  Trouble concentrating 0  Moving slowly or fidgety/restless 0  Suicidal thoughts 0  PHQ-9 Score 0  Difficult doing work/chores Not difficult at all   GAD 7 : Generalized Anxiety Score 03/19/2020  Nervous, Anxious, on Edge 0  Control/stop worrying 0  Worry too much - different things 0  Trouble relaxing 0  Restless 0  Easily annoyed or irritable 0  Afraid - awful might happen 0  Total GAD 7 Score 0  Anxiety Difficulty Not difficult at all       No flowsheet data found.  Immunization History  Administered Date(s) Administered  . Tdap 12/30/2010    No exam data present  Past Medical History:  Diagnosis Date  . Allergy   . Anxiety   . Chicken pox   . Compression fracture of body of thoracic vertebra (HCC)    t12  . Depression   . Diverticulosis 2020   incidental find CT. Sigmoid.   [place]   [place].   . History of migraine   . HLD (hyperlipidemia)   . Rib fracture    left 10th   Allergies  Allergen Reactions  . Codeine Other (See Comments), Hives and Rash  .  Poison Ivy Extract Itching   Past Surgical History:  Procedure Laterality Date  . FEMUR CLOSED REDUCTION Right    pin  . KYPHOPLASTY     T8   Family History  Problem Relation Age of Onset  . Hyperlipidemia Mother   . Breast cancer Mother   . Diabetes Mother   . Early death Father 43  . Heart attack Father   . Pancreatic cancer Father   . Fibromyalgia Sister   . Alcohol abuse Brother   . Early death Brother 65  . Heart attack Brother 25  . Arthritis Maternal Grandmother        Died at 44 years old  . Cancer Maternal Grandfather        Diet  73 years old  . Hearing loss Maternal Grandfather   . Stroke Paternal Grandfather        Died at 20 years old  . Heart attack Paternal Grandfather   . Colon cancer Paternal Uncle   . Throat cancer Paternal Uncle    Social History   Social History Narrative   Marital status/children/pets: Single   Education/employment: Secretary/administrator educated, employed as a Leisure centre manager:      -Wears a bicycle helmet riding a bike: Yes     -smoke alarm in the home:Yes     - wears seatbelt: Yes     - Feels safe in their relationships: Yes    Allergies as of 07/18/2020      Reactions   Codeine Other (See Comments), Hives, Rash   Poison Ivy Extract Itching      Medication List       Accurate as of July 18, 2020 11:59 PM. If you have any questions, ask your nurse or doctor.        atorvastatin 40 MG tablet Commonly known as: LIPITOR Take 1 tablet (40 mg total) by mouth at bedtime.   clonazePAM 1 MG tablet Commonly known as: KLONOPIN Take 1 tablet (1 mg total) by mouth at bedtime.   dicyclomine 10 MG capsule Commonly known as: Bentyl Take 1 capsule (10 mg total) by mouth 4 (four) times daily -  before meals and at bedtime. Started by: Howard Pouch, DO   sertraline 100 MG tablet Commonly known as: ZOLOFT Take 1 tablet (100 mg total) by mouth daily.       All past medical history, surgical history, allergies, family history, immunizations andmedications were updated in the EMR today and reviewed under the history and medication portions of their EMR.    Recent Results (from the past 2160 hour(s))  CBC w/Diff     Status: None   Collection Time: 07/18/20 10:20 AM  Result Value Ref Range   WBC 8.4 4.0 - 10.5 K/uL   RBC 4.99 4.22 - 5.81 Mil/uL   Hemoglobin 15.9 13.0 - 17.0 g/dL   HCT 45.8 39 - 52 %   MCV 91.7 78.0 - 100.0 fl   MCHC 34.7 30.0 - 36.0 g/dL   RDW 14.1 11.5 - 15.5 %   Platelets 154.0 150 - 400 K/uL   Neutrophils Relative % 58.8 43 - 77 %   Lymphocytes Relative 33.7  12 - 46 %   Monocytes Relative 5.4 3 - 12 %   Eosinophils Relative 1.3 0 - 5 %   Basophils Relative 0.8 0 - 3 %   Neutro Abs 4.9 1.4 - 7.7 K/uL   Lymphs Abs 2.8 0.7 - 4.0 K/uL   Monocytes Absolute 0.5 0 -  1 K/uL   Eosinophils Absolute 0.1 0 - 0 K/uL   Basophils Absolute 0.1 0 - 0 K/uL  Comp Met (CMET)     Status: None   Collection Time: 07/18/20 10:20 AM  Result Value Ref Range   Sodium 139 135 - 145 mEq/L   Potassium 4.4 3.5 - 5.1 mEq/L   Chloride 105 96 - 112 mEq/L   CO2 27 19 - 32 mEq/L   Glucose, Bld 96 70 - 99 mg/dL   BUN 11 6 - 23 mg/dL   Creatinine, Ser 1.13 0.40 - 1.50 mg/dL   Total Bilirubin 0.6 0.2 - 1.2 mg/dL   Alkaline Phosphatase 99 39 - 117 U/L   AST 12 0 - 37 U/L   ALT 10 0 - 53 U/L   Total Protein 7.2 6.0 - 8.3 g/dL   Albumin 4.5 3.5 - 5.2 g/dL   GFR 68.23 >60.00 mL/min   Calcium 9.8 8.4 - 10.5 mg/dL  TSH     Status: None   Collection Time: 07/18/20 10:20 AM  Result Value Ref Range   TSH 1.62 0.35 - 4.50 uIU/mL  Hemoglobin A1c     Status: None   Collection Time: 07/18/20 10:20 AM  Result Value Ref Range   Hgb A1c MFr Bld 5.8 4.6 - 6.5 %    Comment: Glycemic Control Guidelines for People with Diabetes:Non Diabetic:  <6%Goal of Therapy: <7%Additional Action Suggested:  >8%   Lipid panel     Status: Abnormal   Collection Time: 07/18/20 10:20 AM  Result Value Ref Range   Cholesterol 176 0 - 200 mg/dL    Comment: ATP III Classification       Desirable:  < 200 mg/dL               Borderline High:  200 - 239 mg/dL          High:  > = 240 mg/dL   Triglycerides 129.0 0 - 149 mg/dL    Comment: Normal:  <150 mg/dLBorderline High:  150 - 199 mg/dL   HDL 33.60 (L) >39.00 mg/dL   VLDL 25.8 0.0 - 40.0 mg/dL   LDL Cholesterol 117 (H) 0 - 99 mg/dL   Total CHOL/HDL Ratio 5     Comment:                Men          Women1/2 Average Risk     3.4          3.3Average Risk          5.0          4.42X Average Risk          9.6          7.13X Average Risk          15.0           11.0                       NonHDL 142.68     Comment: NOTE:  Non-HDL goal should be 30 mg/dL higher than patient's LDL goal (i.e. LDL goal of < 70 mg/dL, would have non-HDL goal of < 100 mg/dL)  PSA     Status: None   Collection Time: 07/18/20 10:20 AM  Result Value Ref Range   PSA 0.34 0.10 - 4.00 ng/mL    Comment: Test performed using Access [employer] PSA Assay, a parmagnetic partical, chemiluminecent immunoassay.  ROS: 14 pt review of systems performed and negative (unless mentioned in an HPI)  Objective: BP 110/71 (BP Location: Right Arm, Patient Position: Sitting, Cuff Size: Large)   Pulse 74   Ht 6' 3"  (1.905 m)   Wt 242 lb 9.6 oz (110 kg)   SpO2 96%   BMI 30.32 kg/m  Gen: Afebrile. No acute distress.  HENT: AT. [place]. No cough or hoarseness.  Eyes:Pupils Equal Round Reactive to light, Extraocular movements intact,  Conjunctiva without redness, discharge or icterus. Neck/lymp/endocrine: Supple,no lymphadenopathy, no thyromegaly CV: RRR no murmur, no edema, +2/4 P posterior tibialis pulses Chest: CTAB, no wheeze or crackles Abd: Soft. flatNTND. BS present. no Masses palpated.  Skin: no rashes, purpura or petechiae.  Neuro:  Normal gait. PERLA. EOMi. Alert. Oriented x3  Psych: Normal affect, dress and demeanor. Normal speech. Normal thought content and judgment.  Assessment/plan: Joseph Sitar. is a 54 y.o. male present for est care w/acute complaint and CMC GAD (generalized anxiety disorder)/Controlled substance agreement signed/Chronic prescription benzodiazepine use Stable.  -continue  Zoloft -continue  [place] controlled substance database reviewed  -Chronic controlled substance agreement signed  -UDS UTD -Patient aware of her controlled substance agreement.  He must present face-to-face prior to needing refills every 5-1/2 months to continue Klonopin prescription.   Mixed hyperlipidemia Stable.  Continue atorvastatin 40 mg daily  Multiple  pulmonary nodules/Pulmonary emphysema, unspecified emphysema type (HCC)/current smoker Continue follow-up routinely with pulmonology.  Reviewed recent CT 09/2019 which favors benign etiology.   Mixed hyperlipidemia Stable. Continue atorvastatin - Comp Met (CMET) - TSH - Lipid panel  Diarrhea, unspecified type/Bowel habit changes Pt has had about 10 lbs of unintentional weight loss. He will benefit from GI evaluation.  Bentyl prescribed - CBC w/Diff - Comp Met (CMET) - TSH - Hemoglobin A1c - PSA - Ambulatory referral to Gastroenterology    Return in about 6 months (around 01/06/2021) for CMC (30 min).  Orders Placed This Encounter  Procedures  . CBC w/Diff  . Comp Met (CMET)  . TSH  . Hemoglobin A1c  . Lipid panel  . PSA  . Ambulatory referral to Gastroenterology   Meds ordered this encounter  Medications  . sertraline (ZOLOFT) 100 MG tablet    Sig: Take 1 tablet (100 mg total) by mouth daily.    Dispense:  90 tablet    Refill:  1    Please DC all other scripts by other providers and prior 30d script prescribed. Thank you.  Marland Kitchen atorvastatin (LIPITOR) 40 MG tablet    Sig: Take 1 tablet (40 mg total) by mouth at bedtime.    Dispense:  90 tablet    Refill:  3    DC all scripts by other providers. Thanks.  . dicyclomine (BENTYL) 10 MG capsule    Sig: Take 1 capsule (10 mg total) by mouth 4 (four) times daily -  before meals and at bedtime.    Dispense:  120 capsule    Refill:  1  . clonazePAM (KLONOPIN) 1 MG tablet    Sig: Take 1 tablet (1 mg total) by mouth at bedtime.    Dispense:  90 tablet    Refill:  1    Referral Orders     Ambulatory referral to Gastroenterology   Note is dictated utilizing voice recognition software. Although note has been proof read prior to signing, occasional typographical errors still can be missed. If any questions arise, please do not hesitate to call for verification.  Electronically signed by: Howard Pouch, DO [place]

## 2020-07-19 ENCOUNTER — Telehealth: Payer: Self-pay | Admitting: Family Medicine

## 2020-07-19 NOTE — Telephone Encounter (Signed)
Please call patient Liver, kidney and thyroid function are normal Blood cell counts and electrolytes are normal Diabetes screening/A1c is normal at 5.8 Cholesterol panel looks good and is at goal. PSA/prostate cancer screening is normal  By labs no obvious cause of bowel habit changes or explain his unintentional weight loss.  He has been referred to gastroenterology during his appointment for further evaluation.

## 2020-07-19 NOTE — Telephone Encounter (Signed)
Called pt and left detailed voicemail with MD message. Encouraged pt to return phone call if he has any questions.

## 2020-07-24 ENCOUNTER — Ambulatory Visit: Payer: 59 | Admitting: Family Medicine

## 2020-07-24 ENCOUNTER — Other Ambulatory Visit: Payer: Self-pay

## 2020-07-24 ENCOUNTER — Telehealth: Payer: Self-pay

## 2020-07-24 VITALS — BP 124/78 | HR 80 | Temp 97.9°F | Resp 16 | Ht 75.0 in | Wt 245.0 lb

## 2020-07-24 DIAGNOSIS — L255 Unspecified contact dermatitis due to plants, except food: Secondary | ICD-10-CM | POA: Diagnosis not present

## 2020-07-24 MED ORDER — METHYLPREDNISOLONE ACETATE 80 MG/ML IJ SUSP
80.0000 mg | Freq: Once | INTRAMUSCULAR | Status: AC
  Administered 2020-07-24: 80 mg via INTRAMUSCULAR

## 2020-07-24 MED ORDER — HYDROXYZINE PAMOATE 25 MG PO CAPS
25.0000 mg | ORAL_CAPSULE | Freq: Three times a day (TID) | ORAL | 0 refills | Status: DC | PRN
Start: 2020-07-24 — End: 2020-10-10

## 2020-07-24 MED ORDER — PREDNISONE 20 MG PO TABS
ORAL_TABLET | ORAL | 0 refills | Status: DC
Start: 2020-07-24 — End: 2020-08-29

## 2020-07-24 NOTE — Telephone Encounter (Signed)
Pt called stating he has poison ivy that is spreading like wild fire. Arms and legs are itching like crazy. Needs meds or injection. Last seen 07/18/20

## 2020-07-24 NOTE — Patient Instructions (Addendum)
Benadryl GEL for itching Fels Naptha for washing after yard work can prevent Steroid got today.  Start Prednisone taper tomorrow.   Vistaril printed script if you need it only for itching or increased anxiety with steroid use. Vistaril can be used every 8 hours (caution MAY make you sleepy).

## 2020-07-24 NOTE — Telephone Encounter (Signed)
Poison ivy spreading like wild fire he said. Arms and legs. Itching like crazy. Seen Dr. Raoul Pitch on 8/12.   Needs meds.  Or injection.  Please call [phone].

## 2020-07-24 NOTE — Addendum Note (Signed)
Addended by: Deveron Furlong D on: 07/24/2020 03:59 PM   Modules accepted: Orders

## 2020-07-24 NOTE — Telephone Encounter (Signed)
Please call pt and schedule him for today in the 4pm slot.  Pt was seen for his [facility] and unintentional weight loss 8/12> he has not been seen or evaluated for this skin development.

## 2020-07-24 NOTE — Progress Notes (Signed)
This visit occurred during the SARS-CoV-2 public health emergency.  Safety protocols were in place, including screening questions prior to the visit, additional usage of staff PPE, and extensive cleaning of exam room while observing appropriate contact time as indicated for disinfecting solutions.    Joseph Ba. , 05/01/1966, 54 y.o., male MRN: [id number] Patient Care Team    Relationship Specialty Notifications Start End  Ma Hillock, DO PCP - General Family Medicine  03/19/20   Jovita Gamma, MD Consulting Physician Neurosurgery  03/19/20   Tsamis, Constance Haw, MD Referring Physician Ophthalmology  03/19/20   [name], DO Consulting Physician Pulmonary Disease  03/19/20   Hamilton Capri Doroteo Bradford, MD Referring Physician Internal Medicine  03/19/20     Chief Complaint  Patient presents with  . Poison ivy    complains of arms and legs itchingfor 1 week and 1 day, spreading all over arms to hands     Subjective: Pt presents for an OV with complaints of new red itchy rash of 1 week duration.  Associated symptoms include small blisters. Occurred after yard work. .   Depression screen [facility] 2/9 03/19/2020  Decreased Interest 0  Down, Depressed, Hopeless 0  PHQ - 2 Score 0  Altered sleeping 0  Tired, decreased energy 0  Change in appetite 0  Feeling bad or failure about yourself  0  Trouble concentrating 0  Moving slowly or fidgety/restless 0  Suicidal thoughts 0  PHQ-9 Score 0  Difficult doing work/chores Not difficult at all    Allergies  Allergen Reactions  . Codeine Other (See Comments), Hives and Rash  . Poison Ivy Extract Itching   Social History   Social History Narrative   Marital status/children/pets: Single   Education/employment: Secretary/administrator educated, employed as a Leisure centre manager:      -Wears a bicycle helmet riding a bike: Yes     -smoke alarm in the home:Yes     - wears seatbelt: Yes     - Feels safe in their relationships: Yes   Past  Medical History:  Diagnosis Date  . Allergy   . Anxiety   . Chicken pox   . Compression fracture of body of thoracic vertebra (HCC)    t12  . Depression   . Diverticulosis 2020   incidental find CT. Sigmoid.   Marland Kitchen Fx femur shaft-closed ([place]) 03/20/2012   right.   . History of migraine   . HLD (hyperlipidemia)   . Rib fracture    left 10th   Past Surgical History:  Procedure Laterality Date  . FEMUR CLOSED REDUCTION Right    pin  . KYPHOPLASTY     T8   Family History  Problem Relation Age of Onset  . Hyperlipidemia Mother   . Breast cancer Mother   . Diabetes Mother   . Early death Father 1  . Heart attack Father   . Pancreatic cancer Father   . Fibromyalgia Sister   . Alcohol abuse Brother   . Early death Brother 72  . Heart attack Brother 52  . Arthritis Maternal Grandmother        Died at 34 years old  . Cancer Maternal Grandfather        Diet 29 years old  . Hearing loss Maternal Grandfather   . Stroke Paternal Grandfather        Died at 79 years old  . Heart attack Paternal Grandfather   . Colon cancer Paternal Uncle   .  Throat cancer Paternal Uncle    Allergies as of 07/24/2020      Reactions   Codeine Other (See Comments), Hives, Rash   Poison Ivy Extract Itching      Medication List       Accurate as of July 24, 2020  3:51 PM. If you have any questions, ask your nurse or doctor.        atorvastatin 40 MG tablet Commonly known as: LIPITOR Take 1 tablet (40 mg total) by mouth at bedtime.   clonazePAM 1 MG tablet Commonly known as: KLONOPIN Take 1 tablet (1 mg total) by mouth at bedtime.   dicyclomine 10 MG capsule Commonly known as: Bentyl Take 1 capsule (10 mg total) by mouth 4 (four) times daily -  before meals and at bedtime.   hydrOXYzine 25 MG capsule Commonly known as: Vistaril Take 1 capsule (25 mg total) by mouth 3 (three) times daily as needed. Started by: Howard Pouch, DO   predniSONE 20 MG tablet Commonly known as:  DELTASONE 60 mg x2d, 40 mg x2d, 20 mg x1d, 10 mg x2d Started by: Howard Pouch, DO   sertraline 100 MG tablet Commonly known as: ZOLOFT Take 1 tablet (100 mg total) by mouth daily.       All past medical history, surgical history, allergies, family history, immunizations andmedications were updated in the EMR today and reviewed under the history and medication portions of their EMR.     ROS: Negative, with the exception of above mentioned in HPI   Objective:  BP 124/78 (BP Location: Right Arm, Patient Position: Sitting, Cuff Size: Large)   Pulse 80   Temp 97.9 F (36.6 C) (Temporal)   Resp 16   Ht 6\' 3"  (1.905 m)   Wt 245 lb (111.1 kg)   SpO2 96%   BMI 30.62 kg/m  Body mass index is 30.62 kg/m. Gen: Afebrile. No acute distress. Nontoxic in appearance, well developed, well nourished.  HENT: AT. [place]. Eyes:Pupils Equal Round Reactive to light, Extraocular movements intact,  Conjunctiva without redness, discharge or icterus. Skin: Red raised small blister formation in linear red base arms, face, chest and between fingers. NO purpura or petechiae.  Neuro: Normal gait. PERLA. EOMi. Alert. Oriented x3 Psych: Normal affect, dress and demeanor. Normal speech. Normal thought content and judgment.  No exam data present No results found. No results found for this or any previous visit (from the past 24 hour(s)).  Assessment/Plan: Joseph Harding. is a 54 y.o. male present for OV for  Plant dermatitis Benadryl GEL for itching Fels Naptha for washing after yard work can prevent IM depo medrol today Start Prednisone taper tomorrow.  Vistaril printed script if you need it only for itching or increased anxiety  F/u prn   Reviewed expectations re: course of current medical issues.  Discussed self-management of symptoms.  Outlined signs and symptoms indicating need for more acute intervention.  Patient verbalized understanding and all questions were answered.  Patient received an  After-Visit Summary.    No orders of the defined types were placed in this encounter.  Meds ordered this encounter  Medications  . predniSONE (DELTASONE) 20 MG tablet    Sig: 60 mg x2d, 40 mg x2d, 20 mg x1d, 10 mg x2d    Dispense:  12 tablet    Refill:  0  . hydrOXYzine (VISTARIL) 25 MG capsule    Sig: Take 1 capsule (25 mg total) by mouth 3 (three) times daily as needed.  Dispense:  30 capsule    Refill:  0   Referral Orders  No referral(s) requested today     Note is dictated utilizing voice recognition software. Although note has been proof read prior to signing, occasional typographical errors still can be missed. If any questions arise, please do not hesitate to call for verification.   electronically signed by:  Howard Pouch, DO  [place]

## 2020-07-24 NOTE — Telephone Encounter (Signed)
Please assist with scheduling, thanks.

## 2020-07-26 NOTE — Telephone Encounter (Signed)
Patient was contacted, scheduled and completed his appt on 8/18. Closing telephone note.

## 2020-08-09 ENCOUNTER — Other Ambulatory Visit: Payer: Self-pay | Admitting: Family Medicine

## 2020-08-29 ENCOUNTER — Ambulatory Visit: Payer: 59 | Admitting: Gastroenterology

## 2020-08-29 ENCOUNTER — Other Ambulatory Visit: Payer: 59

## 2020-08-29 ENCOUNTER — Encounter: Payer: Self-pay | Admitting: Gastroenterology

## 2020-08-29 VITALS — BP 112/74 | HR 77 | Ht 76.0 in | Wt 239.0 lb

## 2020-08-29 DIAGNOSIS — Z1211 Encounter for screening for malignant neoplasm of colon: Secondary | ICD-10-CM | POA: Diagnosis not present

## 2020-08-29 DIAGNOSIS — R197 Diarrhea, unspecified: Secondary | ICD-10-CM

## 2020-08-29 MED ORDER — PLENVU 140 G PO SOLR: 1.0000 | ORAL | 0 refills | Status: DC

## 2020-08-29 NOTE — Progress Notes (Signed)
08/29/2020 Joseph Harding [id number] 1966-10-12   HISTORY OF PRESENT ILLNESS: This is a 54 year old male who is new to our office.  He has been referred here by his PCP, Dr. Raoul Pitch, for evaluation regarding diarrhea and change in bowel habits.  He tells me that his diarrhea was sudden in onset in August.  He says that prior to this he was having normal bowel habits.  He says that it just began one day and since then he has been experiencing a lot of diarrhea and it has not returned to normal.  He says that he's maybe had 3 normal bowel movements in the last 1.5 months.  He complains of a lot of associated bloating and gas.  He says diarrhea is just like water, force like dumping buckets of water.  He says that because of this he has lost about 15 pounds since it began.  He denies seeing any blood in his stool.  He denies any significant abdominal pain.  He does travel for work and has traveled several places in the [place] recently, but but nothing international.  He has travelled internationally in the past and tells me that he has had some infectious processes with traveling on a couple occasions in the past.  He has never had colonoscopy in the past.  CBC, CMP, and TSH were normal in August.   Past Medical History:  Diagnosis Date  . Allergy   . Anxiety   . Chicken pox   . Compression fracture of body of thoracic vertebra (HCC)    t12  . Depression   . Diverticulosis 2020   incidental find CT. Sigmoid.   Marland Kitchen Fx femur shaft-closed ([place]) 03/20/2012   right.   . History of migraine   . HLD (hyperlipidemia)   . Rib fracture    left 10th   Past Surgical History:  Procedure Laterality Date  . FEMUR CLOSED REDUCTION Right    pin  . KYPHOPLASTY     T8    reports that he has been smoking cigarettes. He has a 30.00 pack-year smoking history. He has never used smokeless tobacco. He reports current drug use. Drug: Benzodiazepines. He reports that he does not drink alcohol. family  history includes Alcohol abuse in his brother; Arthritis in his maternal grandmother; Breast cancer in his mother; Cancer in his maternal grandfather; Colon cancer in his paternal uncle; Diabetes in his mother; Early death (age of onset: 19) in his brother; Early death (age of onset: 72) in his father; Fibromyalgia in his sister; Hearing loss in his maternal grandfather; Heart attack in his father and paternal grandfather; Heart attack (age of onset: 49) in his brother; Hyperlipidemia in his mother; Pancreatic cancer in his father; Stroke in his paternal grandfather; Throat cancer in his paternal uncle. Allergies  Allergen Reactions  . Codeine Other (See Comments), Hives and Rash  . Poison Ivy Extract Itching      Outpatient Encounter Medications as of 08/29/2020  Medication Sig  . atorvastatin (LIPITOR) 40 MG tablet Take 1 tablet (40 mg total) by mouth at bedtime.  . clonazePAM (KLONOPIN) 1 MG tablet Take 1 tablet (1 mg total) by mouth at bedtime.  . dicyclomine (BENTYL) 10 MG capsule Take 1 capsule (10 mg total) by mouth 4 (four) times daily -  before meals and at bedtime.  . hydrOXYzine (VISTARIL) 25 MG capsule Take 1 capsule (25 mg total) by mouth 3 (three) times daily as needed.  . sertraline (ZOLOFT)  100 MG tablet Take 1 tablet (100 mg total) by mouth daily.  . [DISCONTINUED] predniSONE (DELTASONE) 20 MG tablet 60 mg x2d, 40 mg x2d, 20 mg x1d, 10 mg x2d   No facility-administered encounter medications on file as of 08/29/2020.     REVIEW OF SYSTEMS  : All other systems reviewed and negative except where noted in the History of Present Illness.   PHYSICAL EXAM: BP 112/74   Pulse 77   Ht 6\' 4"  (1.93 m)   Wt 239 lb (108.4 kg)   BMI 29.09 kg/m  General: Well developed white male in no acute distress Head: Normocephalic and atraumatic Eyes:  Sclerae anicteric, conjunctiva pink. Ears: Normal auditory acuity Lungs: Clear throughout to auscultation; no W/R/R. Heart: Regular rate and  rhythm; no M/R/G. Abdomen: Soft, non-distended.  BS present and hyperactive.  Non-tender. Rectal:  Will be done at the time of colonoscopy. Musculoskeletal: Symmetrical with no gross deformities  Skin: No lesions on visible extremities Extremities: No edema  Neurological: Alert oriented x 4, grossly non-focal Psychological:  Alert and cooperative. Normal mood and affect  ASSESSMENT AND PLAN: *Diarrhea: Sudden onset in August.  He does travel for work and has traveled several places within [employer], nothing internationally recently.  Reports previous infectious processes when visiting other countries in the past.  Suspect this is infectious in origin.  Will order GI pathogen panel.  Advised him to return those to [place] soon as possible.  Otherwise he has never had a colonoscopy and certainly would need this to evaluate his diarrhea if stool studies are negative.  We will plan to schedule that will Dr. Rush Landmark.   *CRC screening:  Never had colonoscopy in the past.  **The risks, benefits, and alternatives to colonoscopy were discussed with the patient and he consents to proceed.    CC:  Kuneff, Renee A, DO

## 2020-08-29 NOTE — Patient Instructions (Signed)
If you are age 52 or older, your body mass index should be between 23-30. Your Body mass index is 29.09 kg/m. If this is out of the aforementioned range listed, please consider follow up with your Primary Care Provider.  If you are age 41 or younger, your body mass index should be between 19-25. Your Body mass index is 29.09 kg/m. If this is out of the aformentioned range listed, please consider follow up with your Primary Care Provider.   Your provider has requested that you go to the basement level for lab work before leaving today. Press "B" on the elevator. The lab is located at the first door on the left as you exit the elevator.  You have been scheduled for a colonoscopy. Please follow written instructions given to you at your visit today.  Please pick up your prep supplies at the pharmacy within the next 1-3 days. If you use inhalers (even only as needed), please bring them with you on the day of your procedure.  Follow up pending the results of you Colonoscopy and labs.

## 2020-08-30 ENCOUNTER — Telehealth: Payer: Self-pay

## 2020-08-30 ENCOUNTER — Other Ambulatory Visit: Payer: Self-pay

## 2020-08-30 ENCOUNTER — Other Ambulatory Visit: Payer: 59

## 2020-08-30 DIAGNOSIS — R197 Diarrhea, unspecified: Secondary | ICD-10-CM

## 2020-08-30 NOTE — Telephone Encounter (Signed)
-----   Message from Irving Copas., MD sent at 08/30/2020  8:18 AM EDT -----   ----- Message ----- From: Loralie Champagne, PA-C Sent: 08/29/2020   1:03 PM EDT To: Irving Copas., MD

## 2020-08-30 NOTE — Telephone Encounter (Signed)
GI Pathogen Panel OK or Culture/O&P/C difficile. Thanks. GM

## 2020-08-30 NOTE — Telephone Encounter (Signed)
The pt completed stool studies and we are waiting results

## 2020-08-30 NOTE — Telephone Encounter (Signed)
Dr Rush Landmark what would you like to order? Stool culture/C diff??

## 2020-08-30 NOTE — Telephone Encounter (Signed)
I have reviewed the chart.   I agree with the Advanced Practitioner's note, impression, and recommendations with any updates as below. Reasonable to rule out infectious etiologies although the longstanding process for the last 2 months makes this seem less likely without significant recent travel outside of the [place].  If infectious work-up is unremarkable then would recommend proceeding with colonoscopy and the addition of an upper endoscopy to rule out celiac disease as well as other small bowel enteropathy's that could be causing symptoms.  If this work-up is unremarkable then exocrine pancreas insufficiency evaluation and SIBO evaluation will need to be performed.   Justice Britain, MD [facility] Office # [phone]

## 2020-08-30 NOTE — Progress Notes (Signed)
Attending Physician's Attestation   I have reviewed the chart.   I agree with the Advanced Practitioner's note, impression, and recommendations with any updates as below. Reasonable to rule out infectious etiologies although the longstanding process for the last 2 months makes this seem less likely without significant recent travel outside of the [place].  If infectious work-up is unremarkable then would recommend proceeding with colonoscopy and the addition of an upper endoscopy to rule out celiac disease as well as other small bowel enteropathy's that could be causing symptoms.  If this work-up is unremarkable then exocrine pancreas insufficiency evaluation and SIBO evaluation will need to be performed.   Justice Britain, MD [facility] Office # [phone]

## 2020-09-03 ENCOUNTER — Encounter: Payer: Self-pay | Admitting: Gastroenterology

## 2020-09-03 LAB — GI PROFILE, STOOL, PCR

## 2020-09-03 LAB — CLOSTRIDIUM DIFFICILE EIA: C difficile Toxins A+B, EIA: NEGATIVE

## 2020-09-04 ENCOUNTER — Other Ambulatory Visit: Payer: Self-pay

## 2020-09-04 MED ORDER — AZITHROMYCIN 500 MG PO TABS: 500.0000 mg | ORAL_TABLET | Freq: Every day | ORAL | 0 refills | Status: AC

## 2020-09-10 ENCOUNTER — Encounter: Payer: 59 | Admitting: Gastroenterology

## 2020-09-24 ENCOUNTER — Inpatient Hospital Stay: Admission: RE | Admit: 2020-09-24 | Payer: 59 | Source: Ambulatory Visit

## 2020-09-26 ENCOUNTER — Telehealth: Payer: Self-pay

## 2020-09-26 NOTE — Telephone Encounter (Signed)
LVM for pt to call pharm for refills

## 2020-09-26 NOTE — Telephone Encounter (Signed)
Spoke with patient and he states he contacted the pharmacy prior to calling our office and nothing was available. Pharmacy contacted and confirmed Rx received on 8/12 for #90 with 1 refill and he picked up Rx on 8/12. LM for pt to return call.

## 2020-09-26 NOTE — Telephone Encounter (Signed)
Patient refill request.  Patient can be reached at [phone] if any issues.  clonazePAM (KLONOPIN) 1 MG tablet [[id number]]    [facility] [place]

## 2020-09-26 NOTE — Telephone Encounter (Signed)
Patient returned call and I relayed refill information. He stated that previously he spoke to the "computer" who told him there were no refills. He will call pharmacy now and speak to live person regarding 1 remaining refill available.

## 2020-10-03 ENCOUNTER — Other Ambulatory Visit: Payer: Self-pay

## 2020-10-03 ENCOUNTER — Ambulatory Visit (INDEPENDENT_AMBULATORY_CARE_PROVIDER_SITE_OTHER)
Admission: RE | Admit: 2020-10-03 | Discharge: 2020-10-03 | Disposition: A | Discharge status: Home / Self Care | Payer: 59 | Source: Ambulatory Visit | Attending: Pulmonary Disease | Admitting: Pulmonary Disease

## 2020-10-03 DIAGNOSIS — R918 Other nonspecific abnormal finding of lung field: Secondary | ICD-10-CM

## 2020-10-10 ENCOUNTER — Other Ambulatory Visit: Payer: Self-pay

## 2020-10-10 ENCOUNTER — Encounter: Payer: Self-pay | Admitting: Pulmonary Disease

## 2020-10-10 ENCOUNTER — Ambulatory Visit: Payer: 59 | Admitting: Pulmonary Disease

## 2020-10-10 NOTE — Progress Notes (Signed)
PCCM:   We attempted to call the patient for the visit 3 times.   There was no answer. We will try again later before clinic closes.   [name], DO [facility] 10/10/2020 4:33 PM

## 2020-10-23 ENCOUNTER — Ambulatory Visit: Payer: 59 | Admitting: Pulmonary Disease

## 2020-11-21 ENCOUNTER — Other Ambulatory Visit: Payer: Self-pay

## 2020-11-21 ENCOUNTER — Encounter: Payer: Self-pay | Admitting: Pulmonary Disease

## 2020-11-21 ENCOUNTER — Ambulatory Visit: Payer: 59 | Admitting: Pulmonary Disease

## 2020-11-21 VITALS — BP 118/70 | HR 80 | Temp 98.9°F | Ht 76.0 in | Wt 240.5 lb

## 2020-11-21 DIAGNOSIS — R918 Other nonspecific abnormal finding of lung field: Secondary | ICD-10-CM | POA: Diagnosis not present

## 2020-11-21 DIAGNOSIS — Z720 Tobacco use: Secondary | ICD-10-CM

## 2020-11-21 DIAGNOSIS — Z716 Tobacco abuse counseling: Secondary | ICD-10-CM

## 2020-11-21 DIAGNOSIS — F17210 Nicotine dependence, cigarettes, uncomplicated: Secondary | ICD-10-CM

## 2020-11-21 DIAGNOSIS — F17200 Nicotine dependence, unspecified, uncomplicated: Secondary | ICD-10-CM

## 2020-11-21 DIAGNOSIS — J438 Other emphysema: Secondary | ICD-10-CM

## 2020-11-21 NOTE — Progress Notes (Signed)
Smoking Cessation Counseling:   The patients current tobacco use: 4-5 cigarettes per day, 30 years, heaviest 1 ppd The patient was advised to quit and impact of smoking on their health.  I assessed the patient's willingness to attempt to quit. I provided methods and skills for cessation. Tapering method discussed.  We reviewed medication management of smoking session drugs if appropriate. Resources to help quit smoking were provided. A smoking cessation quit date was set: January 1st, 2022 Follow-up was arranged in our clinic.  The amount of time spent counseling patient was 10 mins    [name], DO [facility] 11/21/2020 10:27 AM
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
## 2020-11-21 NOTE — Patient Instructions (Signed)
Thank you for visiting Dr. Valeta Harms at [facility]. Today we recommend the following:  Orders Placed This Encounter  Procedures  . CT Chest Wo Contrast   Follow up with me in clinic after your CT chest.   Return in about 10 months (around 09/21/2021) for w/ Dr. Valeta Harms .    Please do your part to reduce the spread of COVID-19.   You must quit smoking or vaping. This is the single most important thing that you can do to improve your lung health.   S = Set a quit date. T = Tell family, friends, and the people around you that you plan to quit. A = Anticipate or plan ahead for the tough times you'll face while quitting. R = Remove cigarettes and other tobacco products from your home, car, and work T = Talk to [place] about getting help to quit  If you need help feel free to reach out to our office, [facility]: [phone], call 1-800-QUIT-NOW, or visit [email or web address]

## 2021-01-17 ENCOUNTER — Telehealth: Payer: Self-pay

## 2021-01-17 NOTE — Telephone Encounter (Signed)
A user error has taken place: encounter opened in error, closed for administrative reasons.

## 2021-01-20 ENCOUNTER — Encounter: Payer: Self-pay | Admitting: Family Medicine

## 2021-01-20 ENCOUNTER — Ambulatory Visit: Payer: 59 | Admitting: Family Medicine

## 2021-01-20 ENCOUNTER — Other Ambulatory Visit: Payer: Self-pay

## 2021-01-20 VITALS — BP 128/78 | HR 79 | Temp 98.8°F | Ht 75.0 in | Wt 241.0 lb

## 2021-01-20 DIAGNOSIS — F411 Generalized anxiety disorder: Secondary | ICD-10-CM | POA: Diagnosis not present

## 2021-01-20 DIAGNOSIS — E782 Mixed hyperlipidemia: Secondary | ICD-10-CM | POA: Diagnosis not present

## 2021-01-20 DIAGNOSIS — Z79899 Other long term (current) drug therapy: Secondary | ICD-10-CM

## 2021-01-20 MED ORDER — ATORVASTATIN CALCIUM 40 MG PO TABS
40.0000 mg | ORAL_TABLET | Freq: Every day | ORAL | 3 refills | Status: DC
Start: 2021-01-20 — End: 2021-09-26

## 2021-01-20 MED ORDER — CLONAZEPAM 1 MG PO TABS
1.0000 mg | ORAL_TABLET | Freq: Every day | ORAL | 1 refills | Status: DC
Start: 2021-01-20 — End: 2021-09-26

## 2021-01-20 MED ORDER — SERTRALINE HCL 100 MG PO TABS: 100.0000 mg | ORAL_TABLET | Freq: Every day | ORAL | 1 refills | Status: DC

## 2021-01-20 NOTE — Progress Notes (Signed)
Patient ID: Joseph Shankel., male  DOB: [birth year], 55 y.o.   MRN: [id number] Patient Care Team    Relationship Specialty Notifications Start End  Ma Hillock, DO PCP - General Family Medicine  03/19/20   Jovita Gamma, MD Consulting Physician Neurosurgery  03/19/20   Tsamis, Constance Haw, MD Referring Physician Ophthalmology  03/19/20   [name], DO Consulting Physician Pulmonary Disease  03/19/20   Hamilton Capri Doroteo Bradford, MD Referring Physician Internal Medicine  03/19/20     Chief Complaint  Patient presents with  . Follow-up    [facility]; pt is fasting     Subjective: Joseph Cunliffe. is a 55 y.o.  male present for [facility] anxiety: Patient has been treated for his depression and anxiety with Zoloft and Klonopin dating back as far as 76.  He reports compliance with Zoloft 100 mg daily and Klonopin 1 mg nightly.  He reports this regimen is working very well for him.  Indication for controlled substance: Anxiety Medication and dose: Klonopin 1 mg nightly # pills per month: 30 pills/month-82-month prescription at a time. Last UDS date: UTD 03/2020 Opioid Treatment Agreement signed (Y/N): Yes [facility] reviewed this encounter (include red flags): Yes, 01/20/21   Mixed hyperlipidemia Patient reports compliance  with Lipitor 40 mg daily. Labs UTD  Multiple pulmonary nodules/Pulmonary emphysema, unspecified emphysema type (HCC)/smoker Patient is a current everyday smoker.  Greater than 30-pack-year history.  Presently established with pulmonology for follow-up on pulmonary nodules and emphysema.  Last CT 09/2019 favoring benign nodules at that time.  Depression screen [facility] 2/9 01/20/2021 03/19/2020  Decreased Interest - 0  Down, Depressed, Hopeless 1 0  PHQ - 2 Score 1 0  Altered sleeping 2 0  Tired, decreased energy 2 0  Change in appetite 0 0  Feeling bad or failure about yourself  0 0  Trouble concentrating 1 0  Moving slowly or fidgety/restless 0 0  Suicidal thoughts 0 0   PHQ-9 Score 6 0  Difficult doing work/chores - Not difficult at all   GAD 7 : Generalized Anxiety Score 01/20/2021 03/19/2020  Nervous, Anxious, on Edge 1 0  Control/stop worrying 0 0  Worry too much - different things 0 0  Trouble relaxing 1 0  Restless 0 0  Easily annoyed or irritable 1 0  Afraid - awful might happen 0 0  Total GAD 7 Score 3 0  Anxiety Difficulty - Not difficult at all       No flowsheet data found.  Immunization History  Administered Date(s) Administered  . Janssen (J&J) SARS-COV-2 Vaccination 04/12/2020  . Moderna Sars-Covid-2 Vaccination 11/27/2020  . Tdap 12/30/2010    No exam data present  Past Medical History:  Diagnosis Date  . Allergy   . Anxiety   . Chicken pox   . Compression fracture of body of thoracic vertebra (HCC)    t12  . Depression   . Diverticulosis 2020   incidental find CT. Sigmoid.   Marland Kitchen Fx femur shaft-closed ([place]) 03/20/2012   right.   . History of migraine   . HLD (hyperlipidemia)   . Rib fracture    left 10th   Allergies  Allergen Reactions  . Codeine Other (See Comments), Hives and Rash  . Poison Ivy Extract Itching   Past Surgical History:  Procedure Laterality Date  . FEMUR CLOSED REDUCTION Right    pin  . KYPHOPLASTY     T8   Family History  Problem Relation Age of  Onset  . Hyperlipidemia Mother   . Breast cancer Mother   . Diabetes Mother   . Early death Father 61  . Heart attack Father   . Pancreatic cancer Father   . Fibromyalgia Sister   . Alcohol abuse Brother   . Early death Brother 36  . Heart attack Brother 22  . Arthritis Maternal Grandmother        Died at 18 years old  . Cancer Maternal Grandfather        Diet 8 years old  . Hearing loss Maternal Grandfather   . Stroke Paternal Grandfather        Died at 23 years old  . Heart attack Paternal Grandfather   . Colon cancer Paternal Uncle   . Throat cancer Paternal Uncle    Social History   Social History Narrative   Marital  status/children/pets: Single   Education/employment: Secretary/administrator educated, employed as a Leisure centre manager:      -Wears a bicycle helmet riding a bike: Yes     -smoke alarm in the home:Yes     - wears seatbelt: Yes     - Feels safe in their relationships: Yes    Allergies as of 01/20/2021      Reactions   Codeine Other (See Comments), Hives, Rash   Poison Ivy Extract Itching      Medication List       Accurate as of January 20, 2021  8:34 AM. If you have any questions, ask your nurse or doctor.        atorvastatin 40 MG tablet Commonly known as: LIPITOR Take 1 tablet (40 mg total) by mouth at bedtime.   clonazePAM 1 MG tablet Commonly known as: KLONOPIN Take 1 tablet (1 mg total) by mouth at bedtime.   sertraline 100 MG tablet Commonly known as: ZOLOFT Take 1 tablet (100 mg total) by mouth daily.       All past medical history, surgical history, allergies, family history, immunizations andmedications were updated in the EMR today and reviewed under the history and medication portions of their EMR.    No results found for this or any previous visit (from the past 2160 hour(s)).   ROS: 14 pt review of systems performed and negative (unless mentioned in an HPI)  Objective: BP 128/78   Pulse 79   Temp 98.8 F (37.1 C) (Oral)   Ht 6\' 3"  (1.905 m)   Wt 241 lb (109.3 kg)   SpO2 99%   BMI 30.12 kg/m  Gen: Afebrile. No acute distress.  HENT: AT. [place].  Eyes:Pupils Equal Round Reactive to light, Extraocular movements intact,  Conjunctiva without redness, discharge or icterus. Neuro: Normal gait. PERLA. EOMi. Alert. Oriented x3 Psych: Normal affect, dress and demeanor. Normal speech. Normal thought content and judgment.    Assessment/plan: Joseph Garno. is a 55 y.o. male present for est care w/acute complaint and [facility] GAD (generalized anxiety disorder)/Controlled substance agreement signed/Chronic prescription benzodiazepine use Stable.  -continue Zoloft -continue   [place] controlled substance database reviewed 01/20/21 -Chronic controlled substance agreement signed  -UDS UTD -Patient aware of her controlled substance agreement.  He must present face-to-face prior to needing refills every 5.5 months to continue Klonopin prescription.   Mixed hyperlipidemia Stable.  Continue  atorvastatin - labs utd   Return in about 24 weeks (around 07/07/2021) for CPE (30 min), [facility] (30 min).  No orders of the defined types were placed in this encounter.  Meds ordered  this encounter  Medications  . sertraline (ZOLOFT) 100 MG tablet    Sig: Take 1 tablet (100 mg total) by mouth daily.    Dispense:  90 tablet    Refill:  1  . atorvastatin (LIPITOR) 40 MG tablet    Sig: Take 1 tablet (40 mg total) by mouth at bedtime.    Dispense:  90 tablet    Refill:  3    DC all scripts by other providers. Thanks.   Referral Orders  No referral(s) requested today     Note is dictated utilizing voice recognition software. Although note has been proof read prior to signing, occasional typographical errors still can be missed. If any questions arise, please do not hesitate to call for verification.  Electronically signed by: Howard Pouch, DO [place]

## 2021-01-20 NOTE — Patient Instructions (Addendum)
Great to see you today  I have refilled your meds.  Next appt in 5.5 mos- schedule as your physical and we will also perform your labs that day.  Sooner if needed.

## 2021-08-01 ENCOUNTER — Ambulatory Visit: Payer: 59 | Admitting: Family Medicine

## 2021-08-01 ENCOUNTER — Other Ambulatory Visit: Payer: Self-pay

## 2021-08-01 ENCOUNTER — Encounter: Payer: Self-pay | Admitting: Family Medicine

## 2021-08-01 VITALS — BP 116/69 | HR 76 | Temp 97.9°F | Ht 75.0 in | Wt 233.0 lb

## 2021-08-01 DIAGNOSIS — M7022 Olecranon bursitis, left elbow: Secondary | ICD-10-CM | POA: Diagnosis not present

## 2021-08-01 MED ORDER — DOXYCYCLINE HYCLATE 100 MG PO TABS: 100.0000 mg | ORAL_TABLET | Freq: Two times a day (BID) | ORAL | 0 refills | Status: DC

## 2021-08-01 NOTE — Patient Instructions (Signed)
Elbow Bursitis  Bursitis is swelling and pain at the tip of the elbow. This happens when fluid builds up in a sac under the skin (bursa). This may also be called olecranon bursitis. What are the causes? Elbow bursitis may be caused by: Elbow injury, such as falling onto the elbow. Leaning on hard surfaces for long periods of time. Infection from an injury that breaks the skin near the elbow. A bone growth (spur) that forms at the tip of the elbow. A medical condition that causes inflammation, such as gout or rheumatoid arthritis. Sometimes the cause is not known. What are the signs or symptoms? The first sign of elbow bursitis is usually swelling at the tip of the elbow. This can grow to be about the size of a golf ball. Swelling may start suddenly or develop gradually. Other symptoms may include: Pain when bending or leaning on the elbow. Not being able to move the elbow normally. If bursitis is caused by an infection, you may have: Redness, warmth, and tenderness of the elbow. Drainage of pus from the swollen area over the elbow, if the skin breaks open. How is this diagnosed? This condition may be diagnosed based on: Your symptoms and medical history. Any recent injuries you have had. A physical exam. X-rays to check for a bone spur or fracture. Draining fluid from the bursa to test it for infection. Blood tests to rule out gout or rheumatoid arthritis. How is this treated? Treatment for elbow bursitis depends on the cause. Treatment may include: Medicines. These may include: Over-the-counter medicines to relieve pain and inflammation. Antibiotic medicines. Injections of anti-inflammatory medicines (steroids). Draining fluid from the bursa. Wrapping your elbow with a bandage. Wearing elbow pads. If these treatments do not help, you may need surgery to remove the bursa. Follow these instructions at home: Medicines Take over-the-counter and prescription medicines only as told  by your health care provider. If you were prescribed an antibiotic medicine, take it as told by your health care provider. Do not stop taking the antibiotic even if you start to feel better. Managing pain, stiffness, and swelling  If directed, put ice on your elbow: Put ice in a plastic bag. Place a towel between your skin and the bag. Leave the ice on for 20 minutes, 2-3 times a day. If your bursitis is caused by an injury, rest your elbow and wear your bandage as told by your health care provider. Use elbow pads or elbow wraps to cushion your elbow as needed.  General instructions Avoid any activities that cause elbow pain. Ask your health care provider what activities are safe for you. Keep all follow-up visits as told by your health care provider. This is important. Contact a health care provider if you have: A fever. Symptoms that do not get better with treatment. Pain or swelling that: Gets worse. Goes away and then comes back. Pus draining from your elbow. Get help right away if you have: Trouble moving your arm, hand, or fingers. Summary Elbow bursitis is inflammation of the fluid-filled sac (bursa) between the tip of your elbow bone (olecranon) and your skin. Treatment for elbow bursitis depends on the cause. It may include medicines to relieve pain and inflammation, antibiotic medicines, and draining fluid from your elbow. Contact a health care provider if your symptoms do not get better with treatment, or if your symptoms go away and then come back. This information is not intended to replace advice given to you by your health care provider.  Make sure you discuss any questions you have with your healthcare provider. Document Revised: 05/29/2020 Document Reviewed: 05/29/2020 Elsevier Patient Education  [place].

## 2021-08-01 NOTE — Progress Notes (Signed)
This visit occurred during the SARS-CoV-2 public health emergency.  Safety protocols were in place, including screening questions prior to the visit, additional usage of staff PPE, and extensive cleaning of exam room while observing appropriate contact time as indicated for disinfecting solutions.    Joseph Harding , [birth year], 55 y.o., male MRN: [id number] Patient Care Team    Relationship Specialty Notifications Start End  Joseph Hillock, DO PCP - General Family Medicine  03/19/20   Joseph Gamma, MD Consulting Physician Neurosurgery  03/19/20   Joseph Harding, Joseph Haw, MD Referring Physician Ophthalmology  03/19/20   Joseph Nash, DO Consulting Physician Pulmonary Disease  03/19/20   Joseph Capri Doroteo Bradford, MD Referring Physician Internal Medicine  03/19/20     Chief Complaint  Patient presents with   Joint Swelling    Pt c/o L elbow swelling x 2 mos;      Subjective: Pt presents for an OV with complaints of left elbow discomfort and swelling for 2 months. He does not recall an injury. His job requires him to lean on his elbows frequently. He was seen at an UC about 10 days ago and  started on mobic for a few days and they attempted to drain but did not get a return. He denies fever, chills.   Depression screen [facility] 2/9 01/20/2021 03/19/2020  Decreased Interest - 0  Down, Depressed, Hopeless 1 0  PHQ - 2 Score 1 0  Altered sleeping 2 0  Tired, decreased energy 2 0  Change in appetite 0 0  Feeling bad or failure about yourself  0 0  Trouble concentrating 1 0  Moving slowly or fidgety/restless 0 0  Suicidal thoughts 0 0  PHQ-9 Score 6 0  Difficult doing work/chores - Not difficult at all    Allergies  Allergen Reactions   Codeine Other (See Comments), Hives and Rash   Poison Ivy Extract Itching   Social History   Social History Narrative   Marital status/children/pets: Single   Education/employment: Secretary/administrator educated, employed as a Leisure centre manager:       -Wears a bicycle helmet riding a bike: Yes     -smoke alarm in the home:Yes     - wears seatbelt: Yes     - Feels safe in their relationships: Yes   Past Medical History:  Diagnosis Date   Allergy    Anxiety    Chicken pox    Compression fracture of body of thoracic vertebra ([place])    t12   Depression    Diverticulosis 2020   incidental find CT. Sigmoid.    Fx femur shaft-closed ([place]) 03/20/2012   right.    History of migraine    HLD (hyperlipidemia)    Rib fracture    left 10th   Past Surgical History:  Procedure Laterality Date   FEMUR CLOSED REDUCTION Right    pin   KYPHOPLASTY     T8   Family History  Problem Relation Age of Onset   Hyperlipidemia Mother    Breast cancer Mother    Diabetes Mother    Early death Father 10   Heart attack Father    Pancreatic cancer Father    Fibromyalgia Sister    Alcohol abuse Brother    Early death Brother 51   Heart attack Brother 23   Arthritis Maternal Grandmother        Died at 52 years old   Cancer Maternal Grandfather  Diet 64 years old   Hearing loss Maternal Grandfather    Stroke Paternal Grandfather        Died at 34 years old   Heart attack Paternal Grandfather    Colon cancer Paternal Uncle    Throat cancer Paternal Uncle    Allergies as of 08/01/2021       Reactions   Codeine Other (See Comments), Hives, Rash   Poison Ivy Extract Itching        Medication List        Accurate as of August 01, 2021 11:02 AM. If you have any questions, ask your nurse or doctor.          atorvastatin 40 MG tablet Commonly known as: LIPITOR Take 1 tablet (40 mg total) by mouth at bedtime.   clonazePAM 1 MG tablet Commonly known as: KLONOPIN Take 1 tablet (1 mg total) by mouth at bedtime.   doxycycline 100 MG tablet Commonly known as: VIBRA-TABS Take 1 tablet (100 mg total) by mouth 2 (two) times daily. Started by: Howard Pouch, DO   sertraline 100 MG tablet Commonly known as: ZOLOFT Take 1 tablet  (100 mg total) by mouth daily.        All past medical history, surgical history, allergies, family history, immunizations andmedications were updated in the EMR today and reviewed under the history and medication portions of their EMR.     ROS: Negative, with the exception of above mentioned in HPI   Objective:  BP 116/69   Pulse 76   Temp 97.9 F (36.6 C) (Oral)   Ht '6\' 3"'$  (1.905 m)   Wt 233 lb (105.7 kg)   SpO2 97%   BMI 29.12 kg/m  Body mass index is 29.12 kg/m. Gen: Afebrile. No acute distress. Nontoxic in appearance, well developed, well nourished.  HENT: AT. [place].  MSK: no erythema, soft tissue mass/fluid collection over olecranon process. FROM, NV intact distally.  Skin: no rashes, purpura or petechiae.  Neuro:  Normal gait. PERLA. EOMi. Alert. Oriented x3  Psych: Normal affect, dress and demeanor. Normal speech. Normal thought content and judgment.  No results found. No results found. No results found for this or any previous visit (from the past 24 hour(s)).  Assessment/Plan: Joseph Harding. is a 55 y.o. male present for OV for  Olecranon bursitis of left elbow Olecranon bursitis left elbow of 2 mos duration. No red flags, fever or chills.  Procedure Note:  PROCEDURE NOTE: joint aspiration Performed by: Dr. Raoul Pitch Indication: Left olecranon bursitis Anesthesia was obtained with ethyl chloride application. The area was prepped in the usual sterile fashion. 10 cc syringe with 18 g needle inserted into bursitis. 4.5 cc serosanguinous fluid return. Culture was not obtained.   Post-Procedure Diagnosis: olecranon bursitis Complications: None Estimated Blood Loss:  none F/u 1 week prn. Pt was encourage to wear elastic elbow brace for 1-2 weeks. Doxy BID x 5 days prescribed to be cautious.  Reviewed expectations re: course of current medical issues. Discussed self-management of symptoms. Outlined signs and symptoms indicating need for more acute intervention. Patient  verbalized understanding and all questions were answered. Patient received an After-Visit Summary.    No orders of the defined types were placed in this encounter.  Meds ordered this encounter  Medications   doxycycline (VIBRA-TABS) 100 MG tablet    Sig: Take 1 tablet (100 mg total) by mouth 2 (two) times daily.    Dispense:  10 tablet    Refill:  0  Referral Orders  No referral(s) requested today     Note is dictated utilizing voice recognition software. Although note has been proof read prior to signing, occasional typographical errors still can be missed. If any questions arise, please do not hesitate to call for verification.   electronically signed by:  Howard Pouch, DO  [place]

## 2021-09-22 ENCOUNTER — Ambulatory Visit (INDEPENDENT_AMBULATORY_CARE_PROVIDER_SITE_OTHER)
Admission: RE | Admit: 2021-09-22 | Discharge: 2021-09-22 | Disposition: A | Discharge status: Home / Self Care | Payer: 59 | Source: Ambulatory Visit | Attending: Pulmonary Disease | Admitting: Pulmonary Disease

## 2021-09-22 ENCOUNTER — Other Ambulatory Visit: Payer: Self-pay

## 2021-09-22 DIAGNOSIS — R918 Other nonspecific abnormal finding of lung field: Secondary | ICD-10-CM | POA: Diagnosis not present

## 2021-09-25 ENCOUNTER — Other Ambulatory Visit: Payer: Self-pay | Admitting: Family Medicine

## 2021-09-25 NOTE — Telephone Encounter (Signed)
Due for appt. Please decline

## 2021-09-25 NOTE — Telephone Encounter (Signed)
Controlled substance- must be seen face to face for refill

## 2021-09-26 ENCOUNTER — Ambulatory Visit: Payer: 59 | Admitting: Family Medicine

## 2021-09-26 ENCOUNTER — Encounter: Payer: Self-pay | Admitting: Family Medicine

## 2021-09-26 ENCOUNTER — Other Ambulatory Visit: Payer: Self-pay

## 2021-09-26 VITALS — BP 123/76 | HR 89 | Temp 98.6°F | Wt 234.0 lb

## 2021-09-26 DIAGNOSIS — Z79899 Other long term (current) drug therapy: Secondary | ICD-10-CM

## 2021-09-26 DIAGNOSIS — E782 Mixed hyperlipidemia: Secondary | ICD-10-CM | POA: Diagnosis not present

## 2021-09-26 DIAGNOSIS — R519 Headache, unspecified: Secondary | ICD-10-CM

## 2021-09-26 DIAGNOSIS — F411 Generalized anxiety disorder: Secondary | ICD-10-CM

## 2021-09-26 DIAGNOSIS — I7090 Unspecified atherosclerosis: Secondary | ICD-10-CM | POA: Diagnosis not present

## 2021-09-26 DIAGNOSIS — G8929 Other chronic pain: Secondary | ICD-10-CM

## 2021-09-26 DIAGNOSIS — H539 Unspecified visual disturbance: Secondary | ICD-10-CM

## 2021-09-26 MED ORDER — SERTRALINE HCL 100 MG PO TABS
100.0000 mg | ORAL_TABLET | Freq: Every day | ORAL | 1 refills | Status: DC
Start: 2021-09-26 — End: 2021-12-18

## 2021-09-26 MED ORDER — ATORVASTATIN CALCIUM 40 MG PO TABS: 40.0000 mg | ORAL_TABLET | Freq: Every day | ORAL | 3 refills | Status: DC

## 2021-09-26 MED ORDER — SERTRALINE HCL 100 MG PO TABS: 100.0000 mg | ORAL_TABLET | Freq: Every day | ORAL | 0 refills | Status: DC

## 2021-09-26 MED ORDER — CLONAZEPAM 1 MG PO TABS: 1.0000 mg | ORAL_TABLET | Freq: Every day | ORAL | 1 refills | Status: DC

## 2021-09-26 NOTE — Progress Notes (Signed)
Patient ID: Joseph Harding., male  DOB: Jan 03, 1966, 55 y.o.   MRN: [id number] Patient Care Team    Relationship Specialty Notifications Start End  Ma Hillock, DO PCP - General Family Medicine  03/19/20   Jovita Gamma, MD Consulting Physician Neurosurgery  03/19/20   Tsamis, Constance Haw, MD Referring Physician Ophthalmology  03/19/20   [name], DO Consulting Physician Pulmonary Disease  03/19/20   Hamilton Capri Doroteo Bradford, MD Referring Physician Internal Medicine  03/19/20     Chief Complaint  Patient presents with   Anxiety    CMC; pt is not fasting    Subjective: Joseph Reichenberger. is a 55 y.o.  male present for [facility] anxiety: Patient has been treated for his depression and anxiety with Zoloft and Klonopin dating back as far as 2000.  He reports compliance with Zoloft 100 mg daily and Klonopin 1 mg nightly.  He reports this regimen is working very well for him.  Indication for controlled substance: Anxiety Medication and dose: Klonopin 1 mg nightly # pills per month: 30 pills/month-3-month prescription at a time. Last UDS date: UTD 03/2020 Opioid Treatment Agreement signed (Y/N): Yes N[facility]S[place] reviewed this encounter (include red flags): Yes, 09/29/21   Mixed hyperlipidemia Patient reports compliance  with Lipitor 40 mg daily. Labs UTD  Multiple pulmonary nodules/Pulmonary emphysema, unspecified emphysema type (HCC)/smoker Patient is a current everyday smoker.  Greater than 30-pack-year history.  Presently established with pulmonology for follow-up on pulmonary nodules and emphysema.  Last CT 09/2019 favoring benign nodules at that time.  Headache/dizziness/visual disturbance: Patient reports 2 weeks ago he was driving home from [place] when he experienced abrupt visual changes.  He states it appeared the perception of the road was off.  He reports chronic headaches, and he experienced dizziness during that time.  He reports he was unsafe to drive and he pulled off to the  side of the road and called his friends to come pick him up a few hours away in [place] and drive him the rest of the way home.  He reports intermittent visual disturbance and since that time, but nothing quite as bad.  He is never experienced anything like that in the past.  He has a history of hypertension, hyperlipidemia atherosclerosis.  Depression screen [facility] 2/9 09/26/2021 01/20/2021 03/19/2020  Decreased Interest 1 - 0  Down, Depressed, Hopeless 0 1 0  PHQ - 2 Score 1 1 0  Altered sleeping 2 2 0  Tired, decreased energy 2 2 0  Change in appetite 1 0 0  Feeling bad or failure about yourself  0 0 0  Trouble concentrating 1 1 0  Moving slowly or fidgety/restless 1 0 0  Suicidal thoughts 0 0 0  PHQ-9 Score 8 6 0  Difficult doing work/chores - - Not difficult at all   GAD 7 : Generalized Anxiety Score 09/26/2021 01/20/2021 03/19/2020  Nervous, Anxious, on Edge 2 1 0  Control/stop worrying 0 0 0  Worry too much - different things 0 0 0  Trouble relaxing 2 1 0  Restless 2 0 0  Easily annoyed or irritable 2 1 0  Afraid - awful might happen 1 0 0  Total GAD 7 Score 9 3 0  Anxiety Difficulty - - Not difficult at all       No flowsheet data found.  Immunization History  Administered Date(s) Administered   Janssen (J&J) SARS-COV-2 Vaccination 04/12/2020   Moderna Sars-Covid-2 Vaccination 11/27/2020   Tdap 12/30/2010  No results found.  Past Medical History:  Diagnosis Date   Allergy    Anxiety    Chicken pox    Compression fracture of body of thoracic vertebra ([place])    t12   Depression    Diverticulosis 2020   incidental find CT. Sigmoid.    Fx femur shaft-closed ([place]) 03/20/2012   right.    History of migraine    HLD (hyperlipidemia)    Rib fracture    left 10th   Allergies  Allergen Reactions   Codeine Other (See Comments), Hives and Rash   Poison Ivy Extract Itching   Past Surgical History:  Procedure Laterality Date   FEMUR CLOSED REDUCTION Right    pin    KYPHOPLASTY     T8   Family History  Problem Relation Age of Onset   Hyperlipidemia Mother    Breast cancer Mother    Diabetes Mother    Early death Father 51   Heart attack Father    Pancreatic cancer Father    Fibromyalgia Sister    Alcohol abuse Brother    Early death Brother 61   Heart attack Brother 66   Arthritis Maternal Grandmother        Died at 67 years old   Cancer Maternal Grandfather        Diet 60 years old   Hearing loss Maternal Grandfather    Stroke Paternal Grandfather        Died at 56 years old   Heart attack Paternal Grandfather    Colon cancer Paternal Uncle    Throat cancer Paternal Uncle    Social History   Social History Narrative   Marital status/children/pets: Single   Education/employment: Secretary/administrator educated, employed as a Leisure centre manager:      -Wears a bicycle helmet riding a bike: Yes     -smoke alarm in the home:Yes     - wears seatbelt: Yes     - Feels safe in their relationships: Yes    Allergies as of 09/26/2021       Reactions   Codeine Other (See Comments), Hives, Rash   Poison Ivy Extract Itching        Medication List        Accurate as of September 26, 2021 11:59 PM. If you have any questions, ask your nurse or doctor.          STOP taking these medications    doxycycline 100 MG tablet Commonly known as: VIBRA-TABS Stopped by: Howard Pouch, DO       TAKE these medications    atorvastatin 40 MG tablet Commonly known as: LIPITOR Take 1 tablet (40 mg total) by mouth at bedtime.   clonazePAM 1 MG tablet Commonly known as: KLONOPIN Take 1 tablet (1 mg total) by mouth at bedtime.   sertraline 100 MG tablet Commonly known as: ZOLOFT Take 1 tablet (100 mg total) by mouth daily.        All past medical history, surgical history, allergies, family history, immunizations andmedications were updated in the EMR today and reviewed under the history and medication portions of their EMR.    No results found  for this or any previous visit (from the past 2160 hour(s)).   ROS: 14 pt review of systems performed and negative (unless mentioned in an HPI)  Objective: BP 123/76   Pulse 89   Temp 98.6 F (37 C) (Oral)   Wt 234 lb (106.1 kg)   SpO2 97%  BMI 29.25 kg/m  Gen: Afebrile. No acute distress.  Nontoxic, pleasant male HENT: AT. [place].  No cough.  No hoarseness. Eyes:Pupils Equal Round Reactive to light, Extraocular movements intact,  Conjunctiva without redness, discharge or icterus. Neck/lymp/endocrine: Supple, no lymphadenopathy, no thyromegaly CV: RRR no murmur, no edema, Chest: CTAB, no wheeze or crackles Skin: No rashes, purpura or petechiae.  Neuro: Normal gait. PERLA. EOMi. Alert. Oriented x3 Psych: Normal affect, dress and demeanor. Normal speech. Normal thought content and judgment.    Assessment/plan: Joseph Griffing. is a 55 y.o. male present for est care w/acute complaint and [facility] GAD (generalized anxiety disorder)/Controlled substance agreement signed/Chronic prescription benzodiazepine use Stable Continue Joseph Harding controlled substance database reviewed 09/29/21 -Chronic controlled substance agreement signed  -UDS UTD -Patient aware of controlled substance agreement.   He must present face-to-face prior to needing refills every 5.5 months to continue  Klonopin prescription.   Mixed hyperlipidemia Stable Continue atorvastatin - labs utd  Visual disturbance/atherosclerosis/headache-dizziness Uncertain cause for patient's constellation of symptoms.  Cannot rule out stroke, TIA or ophthalmological causes.  Symptoms were significant enough for him to pull off the side of the road and call somebody to come pick him up a few hours away and drive him home.  Will refer to ophthalmology for further evaluation of causes as well as obtain MRI to rule out stroke or brain lesion etc. - Ambulatory referral to Ophthalmology - MR Brain Wo Contrast;  Future  No follow-ups on file.  Orders Placed This Encounter  Procedures   MR Brain Wo Contrast   Ambulatory referral to Ophthalmology    Meds ordered this encounter  Medications   sertraline (ZOLOFT) 100 MG tablet    Sig: Take 1 tablet (100 mg total) by mouth daily.    Dispense:  90 tablet    Refill:  1   atorvastatin (LIPITOR) 40 MG tablet    Sig: Take 1 tablet (40 mg total) by mouth at bedtime.    Dispense:  90 tablet    Refill:  3    DC all scripts by other providers. Thanks.   clonazePAM (KLONOPIN) 1 MG tablet    Sig: Take 1 tablet (1 mg total) by mouth at bedtime.    Dispense:  90 tablet    Refill:  1    Referral Orders         Ambulatory referral to Ophthalmology       Note is dictated utilizing voice recognition software. Although note has been proof read prior to signing, occasional typographical errors still can be missed. If any questions arise, please do not hesitate to call for verification.  Electronically signed by: Howard Pouch, DO [place]

## 2021-09-26 NOTE — Patient Instructions (Signed)
  Great to see you today.  I have refilled the medication(s) we provide.   If labs were collected, we will inform you of lab results once received either by echart message or telephone call.   - echart message- for normal results that have been seen by the patient already.   - telephone call: abnormal results or if patient has not viewed results in their echart.  Start baby asa daily.    I am not certain if your symptoms are eye related or could be brain.

## 2021-09-29 ENCOUNTER — Telehealth: Payer: Self-pay

## 2021-09-29 NOTE — Telephone Encounter (Signed)
Please place new ophthalmology referral.  Patient does not need to be seen more emergently for possible retinal tear/visual disturbance.

## 2021-09-29 NOTE — Telephone Encounter (Signed)
Please advise if pt needs sooner appt.  

## 2021-09-29 NOTE — Telephone Encounter (Signed)
[facility] called stating they did not have any appts available until January. Referral was placed for sudden changes in vision on 09/26/21. Please contact Brandy to let her know if it is okay to schedule pt. If needing sooner appt referral will need to be placed somewhere else.  [employer]  [phone]

## 2021-09-30 NOTE — Telephone Encounter (Signed)
Spoke with [facility] Rama who stated referral is fine and will call pt to sched.

## 2021-10-10 ENCOUNTER — Other Ambulatory Visit: Payer: Self-pay

## 2021-10-10 ENCOUNTER — Ambulatory Visit
Admission: RE | Admit: 2021-10-10 | Discharge: 2021-10-10 | Disposition: A | Discharge status: Home / Self Care | Payer: 59 | Source: Ambulatory Visit | Attending: Family Medicine | Admitting: Family Medicine

## 2021-10-10 DIAGNOSIS — R519 Headache, unspecified: Secondary | ICD-10-CM

## 2021-10-10 DIAGNOSIS — H539 Unspecified visual disturbance: Secondary | ICD-10-CM

## 2021-10-10 DIAGNOSIS — I7090 Unspecified atherosclerosis: Secondary | ICD-10-CM

## 2021-10-10 DIAGNOSIS — G8929 Other chronic pain: Secondary | ICD-10-CM

## 2021-10-13 ENCOUNTER — Other Ambulatory Visit: Payer: Self-pay | Admitting: Family Medicine

## 2021-10-13 ENCOUNTER — Telehealth: Payer: Self-pay | Admitting: Family Medicine

## 2021-10-13 DIAGNOSIS — R42 Dizziness and giddiness: Secondary | ICD-10-CM

## 2021-10-13 DIAGNOSIS — H7091 Unspecified mastoiditis, right ear: Secondary | ICD-10-CM

## 2021-10-13 DIAGNOSIS — H539 Unspecified visual disturbance: Secondary | ICD-10-CM

## 2021-10-13 MED ORDER — CIPRO HC 0.2-1 % OT SUSP: 3.0000 [drp] | Freq: Two times a day (BID) | OTIC | 0 refills | Status: DC

## 2021-10-13 MED ORDER — AMOXICILLIN-POT CLAVULANATE 875-125 MG PO TABS: 1.0000 | ORAL_TABLET | Freq: Two times a day (BID) | ORAL | 0 refills | Status: AC

## 2021-10-13 NOTE — Telephone Encounter (Signed)
Spoke with pt regarding labs and instructions.   

## 2021-10-13 NOTE — Telephone Encounter (Signed)
See other encounter.

## 2021-10-13 NOTE — Telephone Encounter (Signed)
Pt called and said that he received results for MRI Friday and he was a little confused so he asked if someone could call him back to discuss results or let him know if he needs an appt. Call back [phone]

## 2021-10-13 NOTE — Telephone Encounter (Signed)
Please inform patient the following information: His MRI was negative for any acute signs of stroke.  MRI did show signs of right mastoid effusion.  This is a bony area behind the right ear and part of the skull.  This could be from an infection setting into that area and can cause symptoms such as he reported.  We need to get him started on an antibiotic and get him to a ENT for further evaluation and cause.  If this is not treated appropriately, some people go on to need IV antibiotics or even surgical interventions-therefore its very important that we get him evaluated and treated. -I have called in medication called Augmentin 1 tab every 12 hours daily for 2 weeks. -Eardrops for the right ear every 12 hours for  1 week -I have referred him to ENT.  They will call him to get him scheduled ASAP

## 2021-10-13 NOTE — Telephone Encounter (Signed)
Joseph Harding (8:14 AM)   Pt called and said that he received results for MRI Friday and he was a little confused so he asked if someone could call him back to discuss results or let him know if he needs an appt. Call back [phone]

## 2021-10-18 ENCOUNTER — Encounter: Payer: Self-pay | Admitting: *Deleted

## 2021-10-18 NOTE — Progress Notes (Signed)
CT reviewed. Nodules stable. No additional need for follow up imaging regarding nodules.  Should follow up with PCP or Cardiologist regarding coronary artery disease seen on ct imaging.  Please inform patient and if desired please place referral to cardiology, Dr. Harrell Gave   Thanks,  [facility]  [name], DO [facility] 10/18/2021 1:24 PM

## 2021-10-20 ENCOUNTER — Encounter: Payer: Self-pay | Admitting: Family Medicine

## 2021-10-20 ENCOUNTER — Ambulatory Visit: Payer: 59 | Admitting: Family Medicine

## 2021-10-20 ENCOUNTER — Other Ambulatory Visit: Payer: Self-pay

## 2021-10-20 VITALS — BP 119/75 | HR 93 | Temp 97.8°F | Wt 240.0 lb

## 2021-10-20 DIAGNOSIS — M546 Pain in thoracic spine: Secondary | ICD-10-CM | POA: Diagnosis not present

## 2021-10-20 DIAGNOSIS — M25512 Pain in left shoulder: Secondary | ICD-10-CM

## 2021-10-20 MED ORDER — METHYLPREDNISOLONE 4 MG PO TBPK: ORAL_TABLET | ORAL | 0 refills | Status: DC

## 2021-10-20 NOTE — Patient Instructions (Signed)
  Great to see you today.  I have refilled the medication(s) we provide.   If labs were collected, we will inform you of lab results once received either by echart message or telephone call.   - echart message- for normal results that have been seen by the patient already.   - telephone call: abnormal results or if patient has not viewed results in their echart.    [facility] is located at [address] [place], [place] [address]

## 2021-10-20 NOTE — Progress Notes (Signed)
This visit occurred during the SARS-CoV-2 public health emergency.  Safety protocols were in place, including screening questions prior to the visit, additional usage of staff PPE, and extensive cleaning of exam room while observing appropriate contact time as indicated for disinfecting solutions.    Joseph Harding. , Aug 07, 1966, 55 y.o., male MRN: [id number] Patient Care Team    Relationship Specialty Notifications Start End  Ma Hillock, DO PCP - General Family Medicine  03/19/20   Jovita Gamma, MD Consulting Physician Neurosurgery  03/19/20   Tsamis, Constance Haw, MD Referring Physician Ophthalmology  03/19/20   [name], DO Consulting Physician Pulmonary Disease  03/19/20   Hamilton Capri Doroteo Bradford, MD Referring Physician Internal Medicine  03/19/20     Chief Complaint  Patient presents with   Neck Pain    Pt c/o worsen in neck pain that radiates to L shoulder/arm. Pt states that the shoulder is where most of the pain is located; pt also report upper back pain;      Subjective: Pt presents for an OV with complaints of midthoracic back pain and left shoulder pain, that radiates down the back/medial aspect of his arm.  He states it has been this way for quite a few weeks.  He has a history of thoracic kyphoplasty in the past.  He states the pain in his back is approximately at this location.  He denies any known recent injury to his shoulder, neck or back.  He states the discomfort will radiate down his arm.  Depression screen [facility] 2/9 09/26/2021 01/20/2021 03/19/2020  Decreased Interest 1 - 0  Down, Depressed, Hopeless 0 1 0  PHQ - 2 Score 1 1 0  Altered sleeping 2 2 0  Tired, decreased energy 2 2 0  Change in appetite 1 0 0  Feeling bad or failure about yourself  0 0 0  Trouble concentrating 1 1 0  Moving slowly or fidgety/restless 1 0 0  Suicidal thoughts 0 0 0  PHQ-9 Score 8 6 0  Difficult doing work/chores - - Not difficult at all    Allergies  Allergen  Reactions   Codeine Other (See Comments), Hives and Rash   Poison Ivy Extract Itching   Social History   Social History Narrative   Marital status/children/pets: Single   Education/employment: Secretary/administrator educated, employed as a Leisure centre manager:      -Wears a bicycle helmet riding a bike: Yes     -smoke alarm in the home:Yes     - wears seatbelt: Yes     - Feels safe in their relationships: Yes   Past Medical History:  Diagnosis Date   Allergy    Anxiety    Chicken pox    Compression fracture of body of thoracic vertebra ([place])    t12   Depression    Diverticulosis 2020   incidental find CT. Sigmoid.    Fx femur shaft-closed ([place]) 03/20/2012   right.    History of migraine    HLD (hyperlipidemia)    Rib fracture    left 10th   Past Surgical History:  Procedure Laterality Date   FEMUR CLOSED REDUCTION Right    pin   KYPHOPLASTY     T8   Family History  Problem Relation Age of Onset   Hyperlipidemia Mother    Breast cancer Mother    Diabetes Mother    Early death Father 31   Heart attack Father    Pancreatic cancer  Father    Fibromyalgia Sister    Alcohol abuse Brother    Early death Brother 80   Heart attack Brother 62   Arthritis Maternal Grandmother        Died at 29 years old   Cancer Maternal Grandfather        Diet 59 years old   Hearing loss Maternal Grandfather    Stroke Paternal Grandfather        Died at 65 years old   Heart attack Paternal Grandfather    Colon cancer Paternal Uncle    Throat cancer Paternal Uncle    Allergies as of 10/20/2021       Reactions   Codeine Other (See Comments), Hives, Rash   Poison Ivy Extract Itching        Medication List        Accurate as of October 20, 2021 11:59 PM. If you have any questions, ask your nurse or doctor.          amoxicillin-clavulanate 875-125 MG tablet Commonly known as: AUGMENTIN Take 1 tablet by mouth 2 (two) times daily for 14 days.   atorvastatin 40 MG tablet Commonly  known as: LIPITOR Take 1 tablet (40 mg total) by mouth at bedtime.   Cipro HC OTIC suspension Generic drug: ciprofloxacin-hydrocortisone Place 3 drops into the right ear 2 (two) times daily.   clonazePAM 1 MG tablet Commonly known as: KLONOPIN Take 1 tablet (1 mg total) by mouth at bedtime.   methylPREDNISolone 4 MG Tbpk tablet Commonly known as: MEDROL DOSEPAK Take as directed Started by: Howard Pouch, DO   sertraline 100 MG tablet Commonly known as: ZOLOFT Take 1 tablet (100 mg total) by mouth daily.        All past medical history, surgical history, allergies, family history, immunizations andmedications were updated in the EMR today and reviewed under the history and medication portions of their EMR.     ROS: Negative, with the exception of above mentioned in HPI   Objective:  BP 119/75   Pulse 93   Temp 97.8 F (36.6 C) (Oral)   Wt 240 lb (108.9 kg)   SpO2 99%   BMI 30.00 kg/m  Body mass index is 30 kg/m. Gen: Afebrile. No acute distress. Nontoxic in appearance, well developed, well nourished.  HENT: AT. [place].  MSK:  Thoracic: No erythema, bruising or tissue texture changes.  Patient reports mild discomfort to palpation spinous process of T8/T9.  Full range of motion of thoracic spine. Left shoulder: No erythema, no soft tissue swelling.  Mild tender to palpation over AC joint.  No tenderness over biceps groove.  Mild tenderness of deltoid.  Full range of motion.  Negative liftoff test.  Negative Hawkins.  Positive O'Brien's.  Neurovascularly intact distally. Skin: No rashes, purpura or petechiae.  Neuro: Normal gait. PERLA. EOMi. Alert. Oriented x3  Psych: Normal affect, dress and demeanor. Normal speech. Normal thought content and judgment.  No results found. No results found. No results found for this or any previous visit (from the past 24 hour(s)).  Assessment/Plan: [name]. is a 55 y.o. male present for OV for  Acute pain of left shoulder/thoracic  back pain Suspect 2 different etiologies of his discomfort.  He does have a positive O'Brien's test today which indicates possible shoulder etiology of shoulder discomfort.  His back and radiation of discomfort down his left hand may be stemming more from his upper thoracic or cervical etiology.  However he does have tenderness  at T8, which would not necessarily explain the radiation of pain down his left arm.  Given his history of kyphoplasty at T8 we will obtain x-rays today. - DG Thoracic Spine W/Swimmers; Future - DG Shoulder Left; Future Elected to start treatment with Depo-Medrol Dosepak.  Depending upon results will refer to Ortho/neuro versus sports med.   Reviewed expectations re: course of current medical issues. Discussed self-management of symptoms. Outlined signs and symptoms indicating need for more acute intervention. Patient verbalized understanding and all questions were answered. Patient received an After-Visit Summary.    Orders Placed This Encounter  Procedures   DG Thoracic Spine W/Swimmers   DG Shoulder Left   Meds ordered this encounter  Medications   methylPREDNISolone (MEDROL DOSEPAK) 4 MG TBPK tablet    Sig: Take as directed    Dispense:  21 tablet    Refill:  0   Referral Orders  No referral(s) requested today     Note is dictated utilizing voice recognition software. Although note has been proof read prior to signing, occasional typographical errors still can be missed. If any questions arise, please do not hesitate to call for verification.   electronically signed by:  Howard Pouch, DO  [place]

## 2021-10-21 ENCOUNTER — Ambulatory Visit (INDEPENDENT_AMBULATORY_CARE_PROVIDER_SITE_OTHER): Payer: 59

## 2021-10-21 DIAGNOSIS — M546 Pain in thoracic spine: Secondary | ICD-10-CM | POA: Diagnosis not present

## 2021-10-21 DIAGNOSIS — M25512 Pain in left shoulder: Secondary | ICD-10-CM

## 2021-10-22 ENCOUNTER — Telehealth: Payer: Self-pay | Admitting: Family Medicine

## 2021-10-22 MED ORDER — METHOCARBAMOL 500 MG PO TABS: 500.0000 mg | ORAL_TABLET | Freq: Three times a day (TID) | ORAL | 0 refills | Status: DC | PRN

## 2021-10-22 NOTE — Telephone Encounter (Signed)
Spoke with pt regarding labs and instructions.   

## 2021-10-22 NOTE — Telephone Encounter (Signed)
Please call patient: His x-rays did not show evidence of any changes in his thoracic spine.  The level of his kyphoplasty is stable. His shoulder did show some mild arthritis, otherwise nothing specific to point to his cause of pain.  Since x-rays were basically normal, before proceeding with referral, I would suggest he finish the Medrol Dosepak/steroid and I also called in a muscle relaxer called Robaxin today.  The Robaxin may make him sleepy, so explained to him to use this with caution and only when not driving.  Follow-up in 3-4 weeks for reevaluation

## 2021-12-18 ENCOUNTER — Encounter: Payer: Self-pay | Admitting: Family Medicine

## 2021-12-18 ENCOUNTER — Other Ambulatory Visit: Payer: Self-pay

## 2021-12-18 ENCOUNTER — Ambulatory Visit (INDEPENDENT_AMBULATORY_CARE_PROVIDER_SITE_OTHER): Payer: 59 | Admitting: Family Medicine

## 2021-12-18 VITALS — BP 105/71 | HR 80 | Temp 98.4°F | Ht 75.0 in | Wt 238.0 lb

## 2021-12-18 DIAGNOSIS — Z79899 Other long term (current) drug therapy: Secondary | ICD-10-CM

## 2021-12-18 DIAGNOSIS — Z23 Encounter for immunization: Secondary | ICD-10-CM | POA: Diagnosis not present

## 2021-12-18 DIAGNOSIS — Z0000 Encounter for general adult medical examination without abnormal findings: Secondary | ICD-10-CM

## 2021-12-18 DIAGNOSIS — J439 Emphysema, unspecified: Secondary | ICD-10-CM

## 2021-12-18 DIAGNOSIS — F17200 Nicotine dependence, unspecified, uncomplicated: Secondary | ICD-10-CM | POA: Diagnosis not present

## 2021-12-18 DIAGNOSIS — Z131 Encounter for screening for diabetes mellitus: Secondary | ICD-10-CM | POA: Diagnosis not present

## 2021-12-18 DIAGNOSIS — Z125 Encounter for screening for malignant neoplasm of prostate: Secondary | ICD-10-CM

## 2021-12-18 DIAGNOSIS — E669 Obesity, unspecified: Secondary | ICD-10-CM

## 2021-12-18 DIAGNOSIS — F411 Generalized anxiety disorder: Secondary | ICD-10-CM

## 2021-12-18 DIAGNOSIS — E782 Mixed hyperlipidemia: Secondary | ICD-10-CM | POA: Diagnosis not present

## 2021-12-18 LAB — LIPID PANEL
Cholesterol: 183 mg/dL (ref 0–200)
HDL: 38.9 mg/dL — ABNORMAL LOW (ref >39.00)
LDL Cholesterol: 128 mg/dL — ABNORMAL HIGH (ref 0–99)
NonHDL: 144.42
Total CHOL/HDL Ratio: 5
Triglycerides: 82 mg/dL (ref 0.0–149.0)
VLDL: 16.4 mg/dL (ref 0.0–40.0)

## 2021-12-18 LAB — COMPREHENSIVE METABOLIC PANEL
ALT: 15 U/L (ref 0–53)
AST: 15 U/L (ref 0–37)
Albumin: 4.6 g/dL (ref 3.5–5.2)
Alkaline Phosphatase: 85 U/L (ref 39–117)
BUN: 14 mg/dL (ref 6–23)
CO2: 30 mEq/L (ref 19–32)
Calcium: 9.9 mg/dL (ref 8.4–10.5)
Chloride: 101 mEq/L (ref 96–112)
Creatinine, Ser: 1.15 mg/dL (ref 0.40–1.50)
GFR: 72.86 mL/min (ref >60.00)
Glucose, Bld: 89 mg/dL (ref 70–99)
Potassium: 4.6 mEq/L (ref 3.5–5.1)
Sodium: 137 mEq/L (ref 135–145)
Total Bilirubin: 0.5 mg/dL (ref 0.2–1.2)
Total Protein: 7.1 g/dL (ref 6.0–8.3)

## 2021-12-18 LAB — CBC WITH DIFFERENTIAL/PLATELET
Basophils Absolute: 0.1 10*3/uL (ref 0.0–0.1)
Basophils Relative: 0.8 % (ref 0.0–3.0)
Eosinophils Absolute: 0.1 10*3/uL (ref 0.0–0.7)
Eosinophils Relative: 1.4 % (ref 0.0–5.0)
HCT: 48 % (ref 39.0–52.0)
Hemoglobin: 15.9 g/dL (ref 13.0–17.0)
Lymphocytes Relative: 38 % (ref 12.0–46.0)
Lymphs Abs: 2.8 10*3/uL (ref 0.7–4.0)
MCHC: 33.2 g/dL (ref 30.0–36.0)
MCV: 93.3 fl (ref 78.0–100.0)
Monocytes Absolute: 0.4 10*3/uL (ref 0.1–1.0)
Monocytes Relative: 5.6 % (ref 3.0–12.0)
Neutro Abs: 3.9 10*3/uL (ref 1.4–7.7)
Neutrophils Relative %: 54.2 % (ref 43.0–77.0)
Platelets: 168 10*3/uL (ref 150.0–400.0)
RBC: 5.15 Mil/uL (ref 4.22–5.81)
RDW: 13.7 % (ref 11.5–15.5)
WBC: 7.3 10*3/uL (ref 4.0–10.5)

## 2021-12-18 LAB — TSH: TSH: 1.5 u[IU]/mL (ref 0.35–5.50)

## 2021-12-18 LAB — PSA: PSA: 0.37 ng/mL (ref 0.10–4.00)

## 2021-12-18 LAB — HEMOGLOBIN A1C: Hgb A1c MFr Bld: 6.1 % (ref 4.6–6.5)

## 2021-12-18 MED ORDER — CLONAZEPAM 1 MG PO TABS: 1.0000 mg | ORAL_TABLET | Freq: Every day | ORAL | 1 refills | Status: AC

## 2021-12-18 MED ORDER — ATORVASTATIN CALCIUM 40 MG PO TABS
40.0000 mg | ORAL_TABLET | Freq: Every day | ORAL | 3 refills | Status: DC
Start: 2021-12-18 — End: 2022-06-04

## 2021-12-18 MED ORDER — SERTRALINE HCL 100 MG PO TABS: 100.0000 mg | ORAL_TABLET | Freq: Every day | ORAL | 1 refills | Status: AC

## 2021-12-18 NOTE — Patient Instructions (Signed)
Great to see you today.  I have refilled the medication(s) we provide.   If labs were collected, we will inform you of lab results once received either by echart message or telephone call.   - echart message- for normal results that have been seen by the patient already.   - telephone call: abnormal results or if patient has not viewed results in their echart.  Health Maintenance After Age 54 After age 40, you are at a higher risk for certain long-term diseases and infections as well as injuries from falls. Falls are a major cause of broken bones and head injuries in people who are older than age 44. Getting regular preventive care can help to keep you healthy and well. Preventive care includes getting regular testing and making lifestyle changes as recommended by your health care provider. Talk with your health care provider about: Which screenings and tests you should have. A screening is a test that checks for a disease when you have no symptoms. A diet and exercise plan that is right for you. What should I know about screenings and tests to prevent falls? Screening and testing are the best ways to find a health problem early. Early diagnosis and treatment give you the best chance of managing medical conditions that are common after age 24. Certain conditions and lifestyle choices may make you more likely to have a fall. Your health care provider may recommend: Regular vision checks. Poor vision and conditions such as cataracts can make you more likely to have a fall. If you wear glasses, make sure to get your prescription updated if your vision changes. Medicine review. Work with your health care provider to regularly review all of the medicines you are taking, including over-the-counter medicines. Ask your health care provider about any side effects that may make you more likely to have a fall. Tell your health care provider if any medicines that you take make you feel dizzy or sleepy. Strength  and balance checks. Your health care provider may recommend certain tests to check your strength and balance while standing, walking, or changing positions. Foot health exam. Foot pain and numbness, as well as not wearing proper footwear, can make you more likely to have a fall. Screenings, including: Osteoporosis screening. Osteoporosis is a condition that causes the bones to get weaker and break more easily. Blood pressure screening. Blood pressure changes and medicines to control blood pressure can make you feel dizzy. Depression screening. You may be more likely to have a fall if you have a fear of falling, feel depressed, or feel unable to do activities that you used to do. Alcohol use screening. Using too much alcohol can affect your balance and may make you more likely to have a fall. Follow these instructions at home: Lifestyle Do not drink alcohol if: Your health care provider tells you not to drink. If you drink alcohol: Limit how much you have to: 0-1 drink a day for women. 0-2 drinks a day for men. Know how much alcohol is in your drink. In the [place] one drink equals one 12 oz bottle of beer (355 mL), one 5 oz glass of wine (148 mL), or one 1 oz glass of hard liquor (44 mL). Do not use any products that contain nicotine or tobacco. These products include cigarettes, chewing tobacco, and vaping devices, such as e-cigarettes. If you need help quitting, ask your health care provider. Activity  Follow a regular exercise program to stay fit. This will help you  maintain your balance. Ask your health care provider what types of exercise are appropriate for you. If you need a cane or walker, use it as recommended by your health care provider. Wear supportive shoes that have nonskid soles. Safety  Remove any tripping hazards, such as rugs, cords, and clutter. Install safety equipment such as grab bars in bathrooms and safety rails on stairs. Keep rooms and walkways well-lit. General  instructions Talk with your health care provider about your risks for falling. Tell your health care provider if: You fall. Be sure to tell your health care provider about all falls, even ones that seem minor. You feel dizzy, tiredness (fatigue), or off-balance. Take over-the-counter and prescription medicines only as told by your health care provider. These include supplements. Eat a healthy diet and maintain a healthy weight. A healthy diet includes low-fat dairy products, low-fat (lean) meats, and fiber from whole grains, beans, and lots of fruits and vegetables. Stay current with your vaccines. Schedule regular health, dental, and eye exams. Summary Having a healthy lifestyle and getting preventive care can help to protect your health and wellness after age 58. Screening and testing are the best way to find a health problem early and help you avoid having a fall. Early diagnosis and treatment give you the best chance for managing medical conditions that are more common for people who are older than age 41. Falls are a major cause of broken bones and head injuries in people who are older than age 64. Take precautions to prevent a fall at home. Work with your health care provider to learn what changes you can make to improve your health and wellness and to prevent falls. This information is not intended to replace advice given to you by your health care provider. Make sure you discuss any questions you have with your health care provider. Document Revised: 04/14/2021 Document Reviewed: 04/14/2021 Elsevier Patient Education  [place].

## 2021-12-18 NOTE — Progress Notes (Signed)
This visit occurred during the SARS-CoV-2 public health emergency.  Safety protocols were in place, including screening questions prior to the visit, additional usage of staff PPE, and extensive cleaning of exam room while observing appropriate contact time as indicated for disinfecting solutions.    Patient ID: Joseph Morici., male  DOB: August 14, 1966, 56 y.o.   MRN: [id number] Patient Care Team    Relationship Specialty Notifications Start End  Ma Hillock, DO PCP - General Family Medicine  03/19/20   Jovita Gamma, MD Consulting Physician Neurosurgery  03/19/20   Tsamis, Constance Haw, MD Referring Physician Ophthalmology  03/19/20   [name], DO Consulting Physician Pulmonary Disease  03/19/20   Hamilton Capri Doroteo Bradford, MD Referring Physician Internal Medicine  03/19/20     Chief Complaint  Patient presents with   Annual Exam    Pt is fasting    Subjective: Joseph Sedore. is a 56 y.o. male present for CPE/cmc. All past medical history, surgical history, allergies, family history, immunizations, medications and social history were updated in the electronic medical record today. All recent labs, ED visits and hospitalizations within the last year were reviewed.  Health maintenance:  Colonoscopy: P-uncle with colon cancer. He is going to the [place] to have this completed and they will send records Immunizations:  tdap completed today 12/2021, influenza declined, PNA20 next appt for smoking history, shingrix series started today (rpt dose within 5.5 mos- can be nurse appt), covid series completed-counseled on booster Infectious disease screening: HIV  and Hep C completed PSA:  Lab Results  Component Value Date   PSA 0.34 07/18/2020  , pt was counseled on prostate cancer screenings.  Assistive device: none Oxygen CXK:GYJE Patient has a Dental home. Hospitalizations/ED visits: reviewed  anxiety: Patient has been treated for his depression and anxiety with Zoloft and Klonopin  dating back as far as 2000.  He reports compliance  with Zoloft 100 mg daily and Klonopin 1 mg nightly.  He reports this regimen is working very well or him.  Indication for controlled substance: Anxiety Medication and dose: Klonopin 1 mg nightly # pills per month: 30 pills/month-9-month prescription at a time. Last UDS date: updated today Opioid Treatment Agreement signed (Y/N): Yes [facility] reviewed this encounter (include red flags): Yes, 12/18/21    Mixed hyperlipidemia Patient reports compliance  with Lipitor 40 mg daily. Labs UTD   Multiple pulmonary nodules/Pulmonary emphysema, unspecified emphysema type (HCC)/smoker Patient is a current everyday smoker.  Greater than 30-pack-year history.  Presently established with pulmonology for follow-up on pulmonary nodules and emphysema.  Last CT 09/2019 favoring benign nodules at that time.   Depression screen [facility] 2/9 09/26/2021 01/20/2021 03/19/2020  Decreased Interest 1 - 0  Down, Depressed, Hopeless 0 1 0  PHQ - 2 Score 1 1 0  Altered sleeping 2 2 0  Tired, decreased energy 2 2 0  Change in appetite 1 0 0  Feeling bad or failure about yourself  0 0 0  Trouble concentrating 1 1 0  Moving slowly or fidgety/restless 1 0 0  Suicidal thoughts 0 0 0  PHQ-9 Score 8 6 0  Difficult doing work/chores - - Not difficult at all   GAD 7 : Generalized Anxiety Score 09/26/2021 01/20/2021 03/19/2020  Nervous, Anxious, on Edge 2 1 0  Control/stop worrying 0 0 0  Worry too much - different things 0 0 0  Trouble relaxing 2 1 0  Restless 2 0 0  Easily annoyed or irritable 2  1 0  Afraid - awful might happen 1 0 0  Total GAD 7 Score 9 3 0  Anxiety Difficulty - - Not difficult at all        No flowsheet data found.   Immunization History  Administered Date(s) Administered   Janssen (J&J) SARS-COV-2 Vaccination 04/12/2020   Moderna Sars-Covid-2 Vaccination 11/27/2020   Tdap 12/30/2010, 12/18/2021   Zoster Recombinat (Shingrix) 12/18/2021     Past Medical History:  Diagnosis Date   Allergy    Anxiety    Chicken pox    Compression fracture of body of thoracic vertebra ([place])    t12   Depression    Diverticulosis 2020   incidental find CT. Sigmoid.    Fx femur shaft-closed ([place]) 03/20/2012   right.    History of migraine    HLD (hyperlipidemia)    Rib fracture    left 10th   Allergies  Allergen Reactions   Codeine Other (See Comments), Hives and Rash   Poison Ivy Extract Itching   Past Surgical History:  Procedure Laterality Date   FEMUR CLOSED REDUCTION Right    pin   KYPHOPLASTY     T8   Family History  Problem Relation Age of Onset   Hyperlipidemia Mother    Breast cancer Mother    Diabetes Mother    Early death Father 26   Heart attack Father    Pancreatic cancer Father    Fibromyalgia Sister    Alcohol abuse Brother    Early death Brother 66   Heart attack Brother 54   Arthritis Maternal Grandmother        Died at 28 years old   Cancer Maternal Grandfather        Diet 64 years old   Hearing loss Maternal Grandfather    Stroke Paternal Grandfather        Died at 62 years old   Heart attack Paternal Grandfather    Colon cancer Paternal Uncle    Throat cancer Paternal Uncle    Social History   Social History Narrative   Marital status/children/pets: Single   Education/employment: Secretary/administrator educated, employed as a Leisure centre manager:      -Wears a bicycle helmet riding a bike: Yes     -smoke alarm in the home:Yes     - wears seatbelt: Yes     - Feels safe in their relationships: Yes    Allergies as of 12/18/2021       Reactions   Codeine Other (See Comments), Hives, Rash   Poison Ivy Extract Itching        Medication List        Accurate as of December 18, 2021 10:20 AM. If you have any questions, ask your nurse or doctor.          STOP taking these medications    Cipro HC OTIC suspension Generic drug: ciprofloxacin-hydrocortisone Stopped by: Howard Pouch, DO    methocarbamol 500 MG tablet Commonly known as: Robaxin Stopped by: Howard Pouch, DO   methylPREDNISolone 4 MG Tbpk tablet Commonly known as: MEDROL DOSEPAK Stopped by: Howard Pouch, DO       TAKE these medications    atorvastatin 40 MG tablet Commonly known as: LIPITOR Take 1 tablet (40 mg total) by mouth at bedtime.   clonazePAM 1 MG tablet Commonly known as: KLONOPIN Take 1 tablet (1 mg total) by mouth at bedtime.   sertraline 100 MG tablet Commonly known as: ZOLOFT Take 1 tablet (  100 mg total) by mouth daily.       All past medical history, surgical history, allergies, family history, immunizations andmedications were updated in the EMR today and reviewed under the history and medication portions of their EMR.     No results found for this or any previous visit (from the past 2160 hour(s)).  MR Brain Wo Contrast  Result Date: 10/11/2021 CLINICAL DATA:  Dizziness, nonspecific, headache decreased right peripheral vision EXAM: MRI HEAD WITHOUT CONTRAST TECHNIQUE: Multiplanar, multiecho pulse sequences of the brain and surrounding structures were obtained without intravenous contrast. COMPARISON:  No prior MRI available for comparison. FINDINGS: Brain: No restricted diffusion to suggest acute infarct. No acute hemorrhage, mass, mass effect, or midline shift. No hydrocephalus or extra-axial collection. Scattered T2 hyperintense signal in the periventricular white matter, likely the sequela of mild chronic small vessel ischemic disease. No foci of hemosiderin deposition to suggest remote hemorrhage. Vascular: Normal flow voids. Skull and upper cervical spine: Normal marrow signal. Sinuses/Orbits: Negative. Other: Fluid throughout the right mastoid air cells. IMPRESSION: 1. No acute intracranial process. 2. Right mastoid effusion. Electronically Signed   By: Merilyn Baba M.D.   On: 10/11/2021 11:30    ROS 14 pt review of systems performed and negative (unless mentioned in an  HPI)  Objective:  BP 105/71    Pulse 80    Temp 98.4 F (36.9 C) (Oral)    Ht 6\' 3"  (1.905 m)    Wt 238 lb (108 kg)    SpO2 97%    BMI 29.75 kg/m   Physical Exam Constitutional:      General: He is not in acute distress.    Appearance: Normal appearance. He is not ill-appearing, toxic-appearing or diaphoretic.  HENT:     Head: Normocephalic and atraumatic.     Right Ear: Tympanic membrane, ear canal and external ear normal. There is no impacted cerumen.     Left Ear: Tympanic membrane, ear canal and external ear normal. There is no impacted cerumen.     Nose: Nose normal. No congestion or rhinorrhea.     Mouth/Throat:     Mouth: Mucous membranes are moist.     Pharynx: Oropharynx is clear. No oropharyngeal exudate or posterior oropharyngeal erythema.  Eyes:     General: No scleral icterus.       Right eye: No discharge.        Left eye: No discharge.     Extraocular Movements: Extraocular movements intact.     Pupils: Pupils are equal, round, and reactive to light.  Cardiovascular:     Rate and Rhythm: Normal rate and regular rhythm.     Pulses: Normal pulses.     Heart sounds: Normal heart sounds. No murmur heard.   No friction rub. No gallop.  Pulmonary:     Effort: Pulmonary effort is normal. No respiratory distress.     Breath sounds: Normal breath sounds. No stridor. No wheezing, rhonchi or rales.  Chest:     Chest wall: No tenderness.  Abdominal:     General: Abdomen is flat. Bowel sounds are normal. There is no distension.     Palpations: Abdomen is soft. There is no mass.     Tenderness: There is no abdominal tenderness. There is no right CVA tenderness, left CVA tenderness, guarding or rebound.     Hernia: No hernia is present.  Musculoskeletal:        General: No swelling or tenderness. Normal range of motion.  Cervical back: Normal range of motion and neck supple.     Right lower leg: No edema.     Left lower leg: No edema.  Lymphadenopathy:     Cervical:  No cervical adenopathy.  Skin:    General: Skin is warm and dry.     Coloration: Skin is not jaundiced.     Findings: No bruising, lesion or rash.  Neurological:     General: No focal deficit present.     Mental Status: He is alert and oriented to person, place, and time. Mental status is at baseline.     Cranial Nerves: No cranial nerve deficit.     Sensory: No sensory deficit.     Motor: No weakness.     Coordination: Coordination normal.     Gait: Gait normal.     Deep Tendon Reflexes: Reflexes normal.  Psychiatric:        Mood and Affect: Mood normal.        Behavior: Behavior normal.        Thought Content: Thought content normal.        Judgment: Judgment normal.     No results found.  Assessment/plan: Joseph Harding. is a 56 y.o. male present for [place] GAD (generalized anxiety disorder)/Controlled substance agreement signed/Chronic prescription benzodiazepine use Stable continue [place] controlled substance database reviewed 12/18/21 -Chronic controlled substance agreement signed  -UDS UTD -Patient aware of controlled substance agreement.   He must present face-to-face prior to needing refills every 5.5 months to continue  Klonopin prescription.    Mixed hyperlipidemia/obesity Stable Continue  atorvastatin - cbc, cmp, tsh, lipids collected today   Pulmonary emphysema, unspecified emphysema type (HCC)/current smoker Would encourage screening for lung cancer at 24 by CT and AAA screen for smoking.  Diabetes mellitus screening - Hemoglobin A1c Prostate cancer screening - PSA Need for Tdap vaccination - Tdap vaccine greater than or equal to 7yo IM Need for zoster vaccination - Varicella-zoster vaccine IM Routine general medical examination at a health care facility Colonoscopy: P-uncle with colon cancer. He is going to the [place] to have this completed and they will send records Immunizations:  tdap completed today 12/2021,  influenza declined, PNA20 next appt for smoking history, shingrix series started today (rpt dose within 5.5 mos- can be nurse appt), covid series completed-counseled on booster Infectious disease screening: HIV  and Hep C completed Patient was encouraged to exercise greater than 150 minutes a week. Patient was encouraged to choose a diet filled with fresh fruits and vegetables, and lean meats. AVS provided to patient today for education/recommendation on gender specific health and safety maintenance.  Return in about 24 weeks (around 06/04/2022) for CMC (30 min) and 366 days for CPE.  Orders Placed This Encounter  Procedures   Tdap vaccine greater than or equal to 7yo IM   Varicella-zoster vaccine IM   CBC with Differential/Platelet   Comprehensive metabolic panel   Lipid panel   TSH   Drug Monitoring Panel [phone] , Urine   Hemoglobin A1c   PSA   Meds ordered this encounter  Medications   atorvastatin (LIPITOR) 40 MG tablet    Sig: Take 1 tablet (40 mg total) by mouth at bedtime.    Dispense:  90 tablet    Refill:  3   sertraline (ZOLOFT) 100 MG tablet    Sig: Take 1 tablet (100 mg total) by mouth daily.    Dispense:  90 tablet    Refill:  1   clonazePAM (KLONOPIN) 1 MG tablet    Sig: Take 1 tablet (1 mg total) by mouth at bedtime.    Dispense:  90 tablet    Refill:  1   Referral Orders  No referral(s) requested today     Note is dictated utilizing voice recognition software. Although note has been proof read prior to signing, occasional typographical errors still can be missed. If any questions arise, please do not hesitate to call for verification.  Electronically signed by: Howard Pouch, DO [place]

## 2021-12-19 ENCOUNTER — Encounter: Payer: Self-pay | Admitting: Family Medicine

## 2021-12-19 DIAGNOSIS — R7309 Other abnormal glucose: Secondary | ICD-10-CM | POA: Insufficient documentation

## 2021-12-20 LAB — DRUG MONITORING PANEL 376104, URINE
Amphetamines: NEGATIVE ng/mL (ref <500)
Barbiturates: NEGATIVE ng/mL (ref <300)
Benzodiazepines: NEGATIVE ng/mL (ref <100)
Cocaine Metabolite: NEGATIVE ng/mL (ref <150)
Desmethyltramadol: NEGATIVE ng/mL (ref <100)
Opiates: NEGATIVE ng/mL (ref <100)
Oxycodone: NEGATIVE ng/mL (ref <100)
Tramadol: NEGATIVE ng/mL (ref <100)

## 2021-12-20 LAB — DM TEMPLATE

## 2022-01-12 ENCOUNTER — Telehealth: Payer: Self-pay | Admitting: Family Medicine

## 2022-01-12 NOTE — Telephone Encounter (Signed)
Med refill  to be sent to [facility] in [place]. Since his insurance isn't covering meds through [facility]. Pt said he had to pay out of pocket last time.  sertraline sertraline (ZOLOFT) 100 MG tablet  atorvastatin atorvastatin (LIPITOR) 40 MG tablet   [place], [place] - [address] Phone:  [phone]  Fax:  [phone]

## 2022-01-13 NOTE — Telephone Encounter (Signed)
LVM for pt to CB regarding medication.   Note: Pt will need to call [facility] and tell them that he would like to transfer his meds to them.

## 2022-01-13 NOTE — Telephone Encounter (Signed)
Pt was advised of below and to have [facility] call if any concerns with transfers.

## 2022-01-13 NOTE — Telephone Encounter (Signed)
Noted  

## 2022-01-27 ENCOUNTER — Telehealth: Payer: Self-pay | Admitting: Family Medicine

## 2022-01-27 NOTE — Telephone Encounter (Signed)
FYI: Pt called about his last visit for his physical back in 12/18/2021. He said his insurance won't cover for the lab portion of his visit.  Pt said it may have been coded wrong. Gave pt [facility] Billing Department number: [phone].  (No Action needs to be taken here) Just noted information in patients chart.

## 2022-06-04 ENCOUNTER — Ambulatory Visit: Payer: 59 | Admitting: Family Medicine

## 2022-06-04 ENCOUNTER — Encounter: Payer: Self-pay | Admitting: Family Medicine

## 2022-06-04 VITALS — BP 113/73 | HR 75 | Temp 98.0°F | Ht 75.0 in | Wt 239.0 lb

## 2022-06-04 DIAGNOSIS — G40209 Localization-related (focal) (partial) symptomatic epilepsy and epileptic syndromes with complex partial seizures, not intractable, without status epilepticus: Secondary | ICD-10-CM

## 2022-06-04 DIAGNOSIS — I7090 Unspecified atherosclerosis: Secondary | ICD-10-CM | POA: Diagnosis not present

## 2022-06-04 DIAGNOSIS — Z23 Encounter for immunization: Secondary | ICD-10-CM | POA: Diagnosis not present

## 2022-06-04 DIAGNOSIS — R918 Other nonspecific abnormal finding of lung field: Secondary | ICD-10-CM

## 2022-06-04 DIAGNOSIS — F411 Generalized anxiety disorder: Secondary | ICD-10-CM | POA: Diagnosis not present

## 2022-06-04 DIAGNOSIS — E782 Mixed hyperlipidemia: Secondary | ICD-10-CM | POA: Diagnosis not present

## 2022-06-04 MED ORDER — ATORVASTATIN CALCIUM 40 MG PO TABS: 40.0000 mg | ORAL_TABLET | Freq: Every day | ORAL | 3 refills | Status: AC

## 2022-06-04 NOTE — Patient Instructions (Signed)
No follow-ups on file.        Great to see you today.  I have refilled the medication(s) we provide.

## 2022-06-04 NOTE — Progress Notes (Signed)
Patient ID: Joseph Breeding., male  DOB: [birth year], 56 y.o.   MRN: [id number] Patient Care Team    Relationship Specialty Notifications Start End  Ma Hillock, DO PCP - General Family Medicine  03/19/20   Jovita Gamma, MD Consulting Physician Neurosurgery  03/19/20   Tsamis, Constance Haw, MD Referring Physician Ophthalmology  03/19/20   [name], DO Consulting Physician Pulmonary Disease  03/19/20   Hamilton Capri Doroteo Bradford, MD Referring Physician Internal Medicine  03/19/20     Chief Complaint  Patient presents with   Anxiety    Cmc; pt is not fasting    Subjective: Joseph Harding. is a 56 y.o. male present for cmc. All past medical history, surgical history, allergies, family history, immunizations, medications and social history were updated in the electronic medical record today. All recent labs, ED visits and hospitalizations within the last year were reviewed.  anxiety: Patient has been treated for his depression and anxiety with Zoloft and Klonopin dating back as far as 2000.  He reports compliance  with Zoloft 100 mg daily and Klonopin 0.5 mg nightly.  These medications are now being managed by his psychiatry team through the [facility] in [place].  Mixed hyperlipidemia/atherosclerosis Patient reports compliance with Lipitor 40 mg daily. Labs UTD   Multiple pulmonary nodules/Pulmonary emphysema, unspecified emphysema type (HCC)/smoker Patient is a current everyday smoker.  Greater than 30-pack-year history.  Presently established with pulmonology for follow-up on pulmonary nodules and emphysema.       06/04/2022   10:28 AM 12/18/2021   10:20 AM 09/26/2021    3:19 PM 01/20/2021    8:24 AM 03/19/2020    9:22 AM  Depression screen PHQ 2/9  Decreased Interest 2 0 1  0  Down, Depressed, Hopeless 1 1 0 1 0  PHQ - 2 Score '3 1 1 1 '$ 0  Altered sleeping '3 3 2 2 '$ 0  Tired, decreased energy '3 3 2 2 '$ 0  Change in appetite '1 1 1 '$ 0 0  Feeling bad or failure about yourself  0 0  0 0 0  Trouble concentrating '3 2 1 1 '$ 0  Moving slowly or fidgety/restless '2 2 1 '$ 0 0  Suicidal thoughts 0 0 0 0 0  PHQ-9 Score '15 12 8 6 '$ 0  Difficult doing work/chores     Not difficult at all      06/04/2022   10:29 AM 12/18/2021   10:21 AM 09/26/2021    3:19 PM 01/20/2021    8:24 AM  GAD 7 : Generalized Anxiety Score  Nervous, Anxious, on Edge '1 1 2 1  '$ Control/stop worrying 0 0 0 0  Worry too much - different things 1 1 0 0  Trouble relaxing 1 0 2 1  Restless 0 0 2 0  Easily annoyed or irritable '1 2 2 1  '$ Afraid - awful might happen '1 1 1 '$ 0  Total GAD 7 Score '5 5 9 3            '$ No data to display           Immunization History  Administered Date(s) Administered   Janssen (J&J) SARS-COV-2 Vaccination 04/12/2020   Moderna Sars-Covid-2 Vaccination 11/27/2020   Tdap 12/30/2010, 12/18/2021   Zoster Recombinat (Shingrix) 12/18/2021    Past Medical History:  Diagnosis Date   Allergy    Anxiety    Chicken pox    Compression fracture of body of thoracic vertebra (HCC)    t12  Depression    Diverticulosis 2020   incidental find CT. Sigmoid.    Fx femur shaft-closed ([place]) 03/20/2012   right.    History of migraine    HLD (hyperlipidemia)    Rib fracture    left 10th   Allergies  Allergen Reactions   Codeine Other (See Comments), Hives and Rash   Poison Ivy Extract Itching   Past Surgical History:  Procedure Laterality Date   FEMUR CLOSED REDUCTION Right    pin   KYPHOPLASTY     T8   Family History  Problem Relation Age of Onset   Hyperlipidemia Mother    Breast cancer Mother    Diabetes Mother    Early death Father 58   Heart attack Father    Pancreatic cancer Father    Fibromyalgia Sister    Alcohol abuse Brother    Early death Brother 24   Heart attack Brother 76   Arthritis Maternal Grandmother        Died at 14 years old   Cancer Maternal Grandfather        Diet 57 years old   Hearing loss Maternal Grandfather    Stroke Paternal  Grandfather        Died at 35 years old   Heart attack Paternal Grandfather    Colon cancer Paternal Uncle    Throat cancer Paternal Uncle    Social History   Social History Narrative   Marital status/children/pets: Single   Education/employment: Secretary/administrator educated, employed as a Leisure centre manager:      -Wears a bicycle helmet riding a bike: Yes     -smoke alarm in the home:Yes     - wears seatbelt: Yes     - Feels safe in their relationships: Yes    Allergies as of 06/04/2022       Reactions   Codeine Other (See Comments), Hives, Rash   Poison Ivy Extract Itching        Medication List        Accurate as of June 04, 2022  1:06 PM. If you have any questions, ask your nurse or doctor.          atorvastatin 40 MG tablet Commonly known as: LIPITOR Take 1 tablet (40 mg total) by mouth at bedtime.   clonazePAM 1 MG tablet Commonly known as: KLONOPIN Take 1 tablet (1 mg total) by mouth at bedtime. What changed: how much to take   levETIRAcetam 250 MG tablet Commonly known as: KEPPRA Take 1,500 mg by mouth.   sertraline 100 MG tablet Commonly known as: ZOLOFT Take 1 tablet (100 mg total) by mouth daily.       All past medical history, surgical history, allergies, family history, immunizations andmedications were updated in the EMR today and reviewed under the history and medication portions of their EMR.     No results found for this or any previous visit (from the past 2160 hour(s)).  MR Brain Wo Contrast Result Date: 10/11/2021 IMPRESSION: 1. No acute intracranial process. 2. Right mastoid effusion. Electronically Signed   By: Merilyn Baba M.D.   On: 10/11/2021 11:30    ROS 14 pt review of systems performed and negative (unless mentioned in an HPI)  Objective: BP 113/73   Pulse 75   Temp 98 F (36.7 C) (Oral)   Ht '6\' 3"'$  (1.905 m)   Wt 239 lb (108.4 kg)   SpO2 97%   BMI 29.87 kg/m  Physical Exam Vitals and  nursing note reviewed.   Constitutional:      General: He is not in acute distress.    Appearance: Normal appearance. He is not ill-appearing, toxic-appearing or diaphoretic.  HENT:     Head: Normocephalic and atraumatic.     Mouth/Throat:     Mouth: Mucous membranes are moist.  Eyes:     General: No scleral icterus.       Right eye: No discharge.        Left eye: No discharge.     Extraocular Movements: Extraocular movements intact.     Pupils: Pupils are equal, round, and reactive to light.  Cardiovascular:     Rate and Rhythm: Normal rate and regular rhythm.  Pulmonary:     Effort: Pulmonary effort is normal. No respiratory distress.     Breath sounds: Normal breath sounds. No wheezing, rhonchi or rales.  Musculoskeletal:     Cervical back: Neck supple.  Lymphadenopathy:     Cervical: No cervical adenopathy.  Skin:    General: Skin is warm and dry.     Coloration: Skin is not jaundiced or pale.     Findings: No rash.  Neurological:     Mental Status: He is alert and oriented to person, place, and time. Mental status is at baseline.  Psychiatric:        Mood and Affect: Mood normal.        Behavior: Behavior normal.        Thought Content: Thought content normal.        Judgment: Judgment normal.      No results found.  Assessment/plan: Joseph Harding. is a 56 y.o. male present for CPE/CMC GAD (generalized anxiety disorder)/Controlled substance agreement signed/Chronic prescription benzodiazepine use Well managed.  Patient is now established with psychiatry for this condition.  All medications are now taken over by psychiatry. -[facility] controlled substance database reviewed 06/04/22   Mixed hyperlipidemia/obesity/atherosclerosis Stable Continue atorvastatin Follow-up yearly for physicals and fasting labs   Need for zoster vaccination - Varicella-zoster vaccine IM #2 provided today  Multiple pulmonary nodules Continue to follow with pulmonology  Complex partial seizure with  impairment of consciousness ([place]) Newer diagnosis for him.  Managed by neurology, now on Keppra and seeing much improvement.   Return in about 1 year (around 06/05/2023) for cpe (20 min).  Orders Placed This Encounter  Procedures   Varicella-zoster vaccine IM   Meds ordered this encounter  Medications   atorvastatin (LIPITOR) 40 MG tablet    Sig: Take 1 tablet (40 mg total) by mouth at bedtime.    Dispense:  90 tablet    Refill:  3   Referral Orders  No referral(s) requested today     Note is dictated utilizing voice recognition software. Although note has been proof read prior to signing, occasional typographical errors still can be missed. If any questions arise, please do not hesitate to call for verification.  Electronically signed by: Howard Pouch, DO [place]

## 2024-04-08 ENCOUNTER — Emergency Department (HOSPITAL_COMMUNITY)
Admission: EM | Admit: 2024-04-08 | Discharge: 2024-04-08 | Disposition: A | Discharge status: Home / Self Care | Attending: Emergency Medicine | Admitting: Emergency Medicine

## 2024-04-08 ENCOUNTER — Other Ambulatory Visit: Payer: Self-pay

## 2024-04-08 DIAGNOSIS — T63441A Toxic effect of venom of bees, accidental (unintentional), initial encounter: Secondary | ICD-10-CM | POA: Diagnosis not present

## 2024-04-08 DIAGNOSIS — T63451A Toxic effect of venom of hornets, accidental (unintentional), initial encounter: Secondary | ICD-10-CM | POA: Insufficient documentation

## 2024-04-08 DIAGNOSIS — T63461A Toxic effect of venom of wasps, accidental (unintentional), initial encounter: Secondary | ICD-10-CM | POA: Diagnosis not present

## 2024-04-08 MED ORDER — METHYLPREDNISOLONE SODIUM SUCC 125 MG IJ SOLR
125.0000 mg | Freq: Once | INTRAMUSCULAR | Status: AC
  Administered 2024-04-08: 125 mg via INTRAVENOUS
  Filled 2024-04-08: qty 2

## 2024-04-08 MED ORDER — DIPHENHYDRAMINE HCL 25 MG PO TABS: 25.0000 mg | ORAL_TABLET | Freq: Three times a day (TID) | ORAL | 0 refills | Status: AC

## 2024-04-08 MED ORDER — PREDNISONE 20 MG PO TABS: 40.0000 mg | ORAL_TABLET | Freq: Every day | ORAL | 0 refills | Status: AC

## 2024-04-08 MED ORDER — FAMOTIDINE 20 MG PO TABS: 20.0000 mg | ORAL_TABLET | Freq: Two times a day (BID) | ORAL | 0 refills | Status: AC

## 2024-04-08 MED ORDER — SODIUM CHLORIDE 0.9 % IV BOLUS
1000.0000 mL | Freq: Once | INTRAVENOUS | Status: AC
  Administered 2024-04-08: 1000 mL via INTRAVENOUS

## 2024-04-08 MED ORDER — EPINEPHRINE 0.3 MG/0.3ML IJ SOAJ: 0.3000 mg | INTRAMUSCULAR | 0 refills | Status: AC | PRN

## 2024-04-08 NOTE — ED Notes (Signed)
 Pt in room at this time on his cell phone. Not in any acute distress presently.

## 2024-04-08 NOTE — ED Provider Notes (Signed)
 [place] EMERGENCY DEPARTMENT AT [facility] Provider Note   CSN: [id number] Arrival date & time: 04/08/24  1041     History  Chief Complaint  Patient presents with   Allergic Reaction    Joseph Harding. is a 58 y.o. male.  HPI Patient presents with concern of pain, swelling in his lower extremities.  Patient arrives via EMS.  History by them and the patient himself.  Patient was in his usual state of health, tending to one of his hives, as he is in [place] st. he was wearing protective pants, but after disturbing one of the hives, while positioning something he was stung multiple times primarily in his left lower extremity, but also on the right.  Inconsistent report if the patient has the allergy, but EMS provided Benadryl, with some improvement in his pain, swelling he did not receive epinephrine , though he did have mild tachypnea reportedly, this has resolved prior to ED arrival.    Home Medications Prior to Admission medications   Medication Sig Start Date End Date Taking? Authorizing Provider  diphenhydrAMINE (BENADRYL) 25 MG tablet Take 1 tablet (25 mg total) by mouth 3 (three) times daily. Take one tablet three times daily for two days 04/08/24  Yes Dorenda Gandy, MD  EPINEPHrine  0.3 mg/0.3 mL IJ SOAJ injection Inject 0.3 mg into the muscle as needed for anaphylaxis. 04/08/24  Yes Dorenda Gandy, MD  famotidine (PEPCID) 20 MG tablet Take 1 tablet (20 mg total) by mouth 2 (two) times daily. Take one tablet twice daily for two days 04/08/24  Yes Dorenda Gandy, MD  predniSONE  (DELTASONE ) 20 MG tablet Take 2 tablets (40 mg total) by mouth daily with breakfast for 2 days. For the next four days 04/08/24 04/10/24 Yes Dorenda Gandy, MD  atorvastatin  (LIPITOR) 40 MG tablet Take 1 tablet (40 mg total) by mouth at bedtime. 06/04/22   Kuneff, Renee A, DO  clonazePAM  (KLONOPIN ) 1 MG tablet Take 1 tablet (1 mg total) by mouth at bedtime. Patient taking differently: Take 0.5 mg by  mouth at bedtime. 12/18/21   Kuneff, Renee A, DO  levETIRAcetam (KEPPRA) 250 MG tablet Take 1,500 mg by mouth. 05/30/22   [provider]  sertraline  (ZOLOFT ) 100 MG tablet Take 1 tablet (100 mg total) by mouth daily. 12/18/21   Kuneff, Renee A, DO      Allergies    Codeine and Poison ivy extract    Review of Systems   Review of Systems  Physical Exam Updated Vital Signs BP 113/82   Pulse 76   Temp 97.6 F (36.4 C) (Oral)   Resp 16   Ht 6\' 3"  (1.905 m)   Wt 111.1 kg   SpO2 97%   BMI 30.62 kg/m  Physical Exam Vitals and nursing note reviewed.  Constitutional:      General: He is not in acute distress.    Appearance: He is well-developed.  HENT:     Head: Normocephalic and atraumatic.  Eyes:     Conjunctiva/sclera: Conjunctivae normal.  Cardiovascular:     Rate and Rhythm: Normal rate and regular rhythm.     Pulses: Normal pulses.  Pulmonary:     Effort: Pulmonary effort is normal. No respiratory distress.     Breath sounds: No stridor.  Abdominal:     General: There is no distension.  Skin:    General: Skin is warm and dry.     Comments: Throughout the left greater than right distal extremities there are multiple  small areas of raised confluent erythematous skin.  No grossly retained foreign bodies.  Neurological:     Mental Status: He is alert and oriented to person, place, and time.     ED Results / Procedures / Treatments   Labs (all labs ordered are listed, but only abnormal results are displayed) Labs Reviewed - No data to display  EKG EKG Interpretation Date/Time:  Saturday Apr 08 2024 10:55:05 EDT Ventricular Rate:  75 PR Interval:  163 QRS Duration:  96 QT Interval:  388 QTC Calculation: 434 R Axis:   88  Text Interpretation: Sinus rhythm Confirmed by Dorenda Gandy [phone]) on 04/08/2024 10:56:51 AM  Radiology No results found.  Procedures Procedures    Medications Ordered in ED Medications  methylPREDNISolone  sodium succinate  (SOLU-MEDROL ) 125 mg/2 mL injection 125 mg (125 mg Intravenous Given 04/08/24 1059)  sodium chloride 0.9 % bolus 1,000 mL (1,000 mLs Intravenous New Bag/Given 04/08/24 1059)    ED Course/ Medical Decision Making/ A&P                                 Medical Decision Making Adult male presents after sustaining multiple bee stings.  Patient is awake, alert, has no increased changes.  For allergic versus inflammatory response No respiratory depression, patient had continuous cardiac monitoring steroids, fluids,  Cardiac 80 sinus normal pulse ox 95% room air normal  Amount and/or Complexity of Data Reviewed Independent Historian: EMS External Data Reviewed: notes. ECG/medicine tests: ordered and independent interpretation performed. Decision-making details documented in ED Course.  Risk OTC drugs. Prescription drug management. Decision regarding hospitalization.   1:06 PM Patient awake, alert, ambulatory, hemodynamically unremarkable.  He has been monitored for almost 2 hours has had no decompensation, feels better, wounds appear better, has no evidence for anaphylaxis, is appropriate for discharge with ongoing outpatient meds.        Final Clinical Impression(s) / ED Diagnoses Final diagnoses:  Toxic reaction to hornets, wasps and bees, accidental or unintentional, initial encounter    Rx / DC Orders ED Discharge Orders          Ordered    predniSONE  (DELTASONE ) 20 MG tablet  Daily with breakfast        04/08/24 1306    diphenhydrAMINE (BENADRYL) 25 MG tablet  3 times daily        04/08/24 1306    famotidine (PEPCID) 20 MG tablet  2 times daily        04/08/24 1306    EPINEPHrine  0.3 mg/0.3 mL IJ SOAJ injection  As needed        04/08/24 1306              Dorenda Gandy, MD 04/08/24 1306

## 2024-04-08 NOTE — Discharge Instructions (Signed)
Monitor your condition carefully and do not hesitate to return here for concerning changes in your condition. ?

## 2024-04-08 NOTE — ED Triage Notes (Signed)
 Pt was at home working with his bee hive and about 35-50 honey bees stung him. Most bites are bilateral lower legs. 20 G left AC. 50mg  of benadryl given by [facility]
